# Patient Record
Sex: Female | Born: 1990 | Race: Black or African American | Hispanic: No | Marital: Single | Smoking: Former smoker
Health system: Southern US, Community
[De-identification: ages and names within clinical notes are randomized; demographics above are authoritative.]

## PROBLEM LIST (undated history)

## (undated) DIAGNOSIS — I10 Essential (primary) hypertension: Secondary | ICD-10-CM

## (undated) DIAGNOSIS — R011 Cardiac murmur, unspecified: Secondary | ICD-10-CM

## (undated) DIAGNOSIS — N938 Other specified abnormal uterine and vaginal bleeding: Secondary | ICD-10-CM

---
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
## 2008-05-09 ENCOUNTER — Emergency Department (HOSPITAL_COMMUNITY): Admission: EM | Admit: 2008-05-09 | Discharge: 2008-05-09 | Payer: Self-pay | Admitting: Emergency Medicine

## 2008-06-30 ENCOUNTER — Emergency Department (HOSPITAL_COMMUNITY): Admission: EM | Admit: 2008-06-30 | Discharge: 2008-06-30 | Payer: Self-pay | Admitting: Emergency Medicine

## 2008-07-24 ENCOUNTER — Inpatient Hospital Stay (HOSPITAL_COMMUNITY): Admission: AD | Admit: 2008-07-24 | Discharge: 2008-07-24 | Payer: Self-pay | Admitting: Obstetrics & Gynecology

## 2008-11-28 ENCOUNTER — Inpatient Hospital Stay (HOSPITAL_COMMUNITY): Admission: AD | Admit: 2008-11-28 | Discharge: 2008-11-29 | Payer: Self-pay | Admitting: Obstetrics and Gynecology

## 2009-01-07 ENCOUNTER — Inpatient Hospital Stay (HOSPITAL_COMMUNITY): Admission: AD | Admit: 2009-01-07 | Discharge: 2009-01-07 | Payer: Self-pay | Admitting: Obstetrics and Gynecology

## 2009-01-22 ENCOUNTER — Inpatient Hospital Stay (HOSPITAL_COMMUNITY): Admission: AD | Admit: 2009-01-22 | Discharge: 2009-01-22 | Payer: Self-pay | Admitting: Obstetrics and Gynecology

## 2009-02-14 ENCOUNTER — Inpatient Hospital Stay (HOSPITAL_COMMUNITY): Admission: AD | Admit: 2009-02-14 | Discharge: 2009-02-19 | Payer: Self-pay | Admitting: Obstetrics and Gynecology

## 2009-02-16 ENCOUNTER — Encounter (INDEPENDENT_AMBULATORY_CARE_PROVIDER_SITE_OTHER): Payer: Self-pay | Admitting: Obstetrics and Gynecology

## 2009-07-15 ENCOUNTER — Observation Stay (HOSPITAL_COMMUNITY): Admission: EM | Admit: 2009-07-15 | Discharge: 2009-07-15 | Payer: Self-pay | Admitting: Emergency Medicine

## 2009-09-04 ENCOUNTER — Emergency Department (HOSPITAL_COMMUNITY): Admission: EM | Admit: 2009-09-04 | Discharge: 2009-09-04 | Payer: Self-pay | Admitting: Emergency Medicine

## 2010-02-01 ENCOUNTER — Emergency Department (HOSPITAL_COMMUNITY)
Admission: EM | Admit: 2010-02-01 | Discharge: 2010-02-01 | Payer: Self-pay | Source: Home / Self Care | Admitting: Emergency Medicine

## 2010-02-13 ENCOUNTER — Emergency Department (HOSPITAL_COMMUNITY): Admission: EM | Admit: 2010-02-13 | Discharge: 2010-02-13 | Payer: Self-pay | Admitting: Emergency Medicine

## 2010-03-07 ENCOUNTER — Emergency Department (HOSPITAL_COMMUNITY): Admission: EM | Admit: 2010-03-07 | Discharge: 2009-06-29 | Payer: Self-pay | Admitting: Emergency Medicine

## 2010-06-11 LAB — POCT I-STAT, CHEM 8
BUN: 6 mg/dL (ref 6–23)
Calcium, Ion: 1.15 mmol/L (ref 1.12–1.32)
Chloride: 106 mEq/L (ref 96–112)
Glucose, Bld: 96 mg/dL (ref 70–99)
HCT: 34 % — ABNORMAL LOW (ref 36.0–46.0)
Hemoglobin: 11.6 g/dL — ABNORMAL LOW (ref 12.0–15.0)
Potassium: 3.6 mEq/L (ref 3.5–5.1)
Sodium: 139 mEq/L (ref 135–145)
TCO2: 22 mmol/L (ref 0–100)

## 2010-06-11 LAB — URINALYSIS, ROUTINE W REFLEX MICROSCOPIC
Glucose, UA: NEGATIVE mg/dL
Protein, ur: NEGATIVE mg/dL
Specific Gravity, Urine: 1.016 (ref 1.005–1.030)
pH: 6 (ref 5.0–8.0)

## 2010-06-11 LAB — POCT PREGNANCY, URINE: Preg Test, Ur: NEGATIVE

## 2010-06-11 LAB — DIFFERENTIAL
Basophils Absolute: 0 10*3/uL (ref 0.0–0.1)
Basophils Relative: 1 % (ref 0–1)
Monocytes Relative: 8 % (ref 3–12)
Neutro Abs: 2.9 10*3/uL (ref 1.7–7.7)
Neutrophils Relative %: 61 % (ref 43–77)

## 2010-06-11 LAB — CBC
Hemoglobin: 10 g/dL — ABNORMAL LOW (ref 12.0–15.0)
MCHC: 31.9 g/dL (ref 30.0–36.0)
RDW: 19.8 % — ABNORMAL HIGH (ref 11.5–15.5)
WBC: 4.8 10*3/uL (ref 4.0–10.5)

## 2010-06-11 LAB — POCT CARDIAC MARKERS
CKMB, poc: <1 ng/mL — ABNORMAL LOW (ref 1.0–8.0)
Myoglobin, poc: 39 ng/mL (ref 12–200)

## 2010-06-17 LAB — URINALYSIS, ROUTINE W REFLEX MICROSCOPIC
Glucose, UA: NEGATIVE mg/dL
Ketones, ur: NEGATIVE mg/dL
Nitrite: NEGATIVE
Specific Gravity, Urine: 1.015 (ref 1.005–1.030)
pH: 5.5 (ref 5.0–8.0)
pH: 7.5 (ref 5.0–8.0)

## 2010-06-17 LAB — DIFFERENTIAL
Basophils Absolute: 0 10*3/uL (ref 0.0–0.1)
Basophils Relative: 0 % (ref 0–1)
Eosinophils Absolute: 0 10*3/uL (ref 0.0–0.7)
Eosinophils Relative: 1 % (ref 0–5)

## 2010-06-17 LAB — POCT I-STAT, CHEM 8
Calcium, Ion: 1.22 mmol/L (ref 1.12–1.32)
Chloride: 104 mEq/L (ref 96–112)
Creatinine, Ser: 0.9 mg/dL (ref 0.4–1.2)
Glucose, Bld: 75 mg/dL (ref 70–99)
HCT: 24 % — ABNORMAL LOW (ref 36.0–46.0)

## 2010-06-17 LAB — CBC
HCT: 23.6 % — ABNORMAL LOW (ref 36.0–46.0)
MCHC: 34.1 g/dL (ref 30.0–36.0)
MCV: 89.7 fL (ref 78.0–100.0)
Platelets: 307 10*3/uL (ref 150–400)
RDW: 14.4 % (ref 11.5–15.5)

## 2010-06-17 LAB — URINE MICROSCOPIC-ADD ON

## 2010-06-17 LAB — URINE CULTURE: Colony Count: 30000

## 2010-07-03 LAB — COMPREHENSIVE METABOLIC PANEL
ALT: 10 U/L (ref 0–35)
ALT: 13 U/L (ref 0–35)
AST: 20 U/L (ref 0–37)
AST: 21 U/L (ref 0–37)
Alkaline Phosphatase: 144 U/L — ABNORMAL HIGH (ref 39–117)
CO2: 22 mEq/L (ref 19–32)
Calcium: 8.8 mg/dL (ref 8.4–10.5)
Calcium: 9.4 mg/dL (ref 8.4–10.5)
Creatinine, Ser: 0.74 mg/dL (ref 0.4–1.2)
GFR calc Af Amer: >60 mL/min (ref >60)
GFR calc Af Amer: >60 mL/min (ref >60)
Glucose, Bld: 101 mg/dL — ABNORMAL HIGH (ref 70–99)
Potassium: 3.5 mEq/L (ref 3.5–5.1)
Sodium: 135 mEq/L (ref 135–145)
Sodium: 137 mEq/L (ref 135–145)
Total Protein: 6.3 g/dL (ref 6.0–8.3)
Total Protein: 6.3 g/dL (ref 6.0–8.3)

## 2010-07-03 LAB — CBC
HCT: 31 % — ABNORMAL LOW (ref 36.0–46.0)
HCT: 38.5 % (ref 36.0–46.0)
Hemoglobin: 12.4 g/dL (ref 12.0–15.0)
Hemoglobin: 13.1 g/dL (ref 12.0–15.0)
MCHC: 33.8 g/dL (ref 30.0–36.0)
MCHC: 33.8 g/dL (ref 30.0–36.0)
MCHC: 34 g/dL (ref 30.0–36.0)
MCV: 95.2 fL (ref 78.0–100.0)
Platelets: 146 10*3/uL — ABNORMAL LOW (ref 150–400)
RBC: 3.81 MIL/uL — ABNORMAL LOW (ref 3.87–5.11)
RDW: 13.1 % (ref 11.5–15.5)
RDW: 13.3 % (ref 11.5–15.5)
RDW: 13.3 % (ref 11.5–15.5)
RDW: 13.4 % (ref 11.5–15.5)

## 2010-07-03 LAB — RH IMMUNE GLOB WKUP(>/=20WKS)(NOT WOMEN'S HOSP): Fetal Screen: NEGATIVE

## 2010-07-03 LAB — URINALYSIS, ROUTINE W REFLEX MICROSCOPIC
Bilirubin Urine: NEGATIVE
Glucose, UA: NEGATIVE mg/dL
Ketones, ur: NEGATIVE mg/dL
Specific Gravity, Urine: 1.01 (ref 1.005–1.030)
pH: 5 (ref 5.0–8.0)

## 2010-07-03 LAB — WET PREP, GENITAL

## 2010-07-03 LAB — URINE MICROSCOPIC-ADD ON

## 2010-07-03 LAB — LACTATE DEHYDROGENASE
LDH: 154 U/L (ref 94–250)
LDH: 167 U/L (ref 94–250)

## 2010-07-04 LAB — COMPREHENSIVE METABOLIC PANEL
ALT: 14 U/L (ref 0–35)
AST: 21 U/L (ref 0–37)
Albumin: 2.8 g/dL — ABNORMAL LOW (ref 3.5–5.2)
Alkaline Phosphatase: 107 U/L (ref 39–117)
Calcium: 9.3 mg/dL (ref 8.4–10.5)
GFR calc Af Amer: >60 mL/min (ref >60)
Glucose, Bld: 96 mg/dL (ref 70–99)
Potassium: 3.5 mEq/L (ref 3.5–5.1)
Sodium: 136 mEq/L (ref 135–145)
Total Protein: 6.1 g/dL (ref 6.0–8.3)

## 2010-07-04 LAB — URINE MICROSCOPIC-ADD ON

## 2010-07-04 LAB — CBC
Hemoglobin: 11.8 g/dL — ABNORMAL LOW (ref 12.0–15.0)
MCHC: 33.6 g/dL (ref 30.0–36.0)
RDW: 13.5 % (ref 11.5–15.5)

## 2010-07-04 LAB — WET PREP, GENITAL
Clue Cells Wet Prep HPF POC: NONE SEEN
Trich, Wet Prep: NONE SEEN

## 2010-07-04 LAB — URINALYSIS, ROUTINE W REFLEX MICROSCOPIC
Bilirubin Urine: NEGATIVE
Bilirubin Urine: NEGATIVE
Glucose, UA: NEGATIVE mg/dL
Glucose, UA: NEGATIVE mg/dL
Hgb urine dipstick: NEGATIVE
Hgb urine dipstick: NEGATIVE
Protein, ur: NEGATIVE mg/dL
Specific Gravity, Urine: <1.005 — ABNORMAL LOW (ref 1.005–1.030)
Urobilinogen, UA: 0.2 mg/dL (ref 0.0–1.0)
Urobilinogen, UA: 0.2 mg/dL (ref 0.0–1.0)
pH: 6 (ref 5.0–8.0)

## 2010-07-04 LAB — FETAL FIBRONECTIN: Fetal Fibronectin: NEGATIVE

## 2010-07-04 LAB — GC/CHLAMYDIA PROBE AMP, GENITAL
Chlamydia, DNA Probe: NEGATIVE
GC Probe Amp, Genital: NEGATIVE

## 2010-07-04 LAB — URINE CULTURE

## 2010-07-04 LAB — LACTATE DEHYDROGENASE: LDH: 142 U/L (ref 94–250)

## 2010-07-06 LAB — RH IMMUNE GLOBULIN WORKUP (NOT WOMEN'S HOSP)
ABO/RH(D): O NEG
Antibody Screen: NEGATIVE

## 2010-07-06 LAB — URINALYSIS, ROUTINE W REFLEX MICROSCOPIC
Glucose, UA: NEGATIVE mg/dL
Nitrite: NEGATIVE
Specific Gravity, Urine: 1.02 (ref 1.005–1.030)
pH: 6 (ref 5.0–8.0)

## 2010-07-06 LAB — WET PREP, GENITAL

## 2010-07-10 LAB — URINALYSIS, ROUTINE W REFLEX MICROSCOPIC
Glucose, UA: NEGATIVE mg/dL
Hgb urine dipstick: NEGATIVE
Protein, ur: NEGATIVE mg/dL
Specific Gravity, Urine: 1.014 (ref 1.005–1.030)
pH: 6 (ref 5.0–8.0)

## 2010-07-10 LAB — CBC
HCT: 37.8 % (ref 36.0–46.0)
Hemoglobin: 13.2 g/dL (ref 12.0–15.0)
MCV: 91.6 fL (ref 78.0–100.0)
Platelets: 264 10*3/uL (ref 150–400)
RDW: 13.8 % (ref 11.5–15.5)
WBC: 9.9 10*3/uL (ref 4.0–10.5)

## 2010-07-10 LAB — GC/CHLAMYDIA PROBE AMP, GENITAL
Chlamydia, DNA Probe: NEGATIVE
GC Probe Amp, Genital: NEGATIVE

## 2010-07-10 LAB — RH IMMUNE GLOBULIN WORKUP (NOT WOMEN'S HOSP): Antibody Screen: NEGATIVE

## 2010-07-10 LAB — WET PREP, GENITAL: Trich, Wet Prep: NONE SEEN

## 2010-07-16 LAB — DIFFERENTIAL
Lymphocytes Relative: 27 % (ref 12–46)
Lymphs Abs: 1.9 10*3/uL (ref 0.7–4.0)
Monocytes Absolute: 0.4 10*3/uL (ref 0.1–1.0)
Monocytes Relative: 6 % (ref 3–12)
Neutro Abs: 4.8 10*3/uL (ref 1.7–7.7)
Neutrophils Relative %: 66 % (ref 43–77)

## 2010-07-16 LAB — POCT CARDIAC MARKERS
CKMB, poc: <1 ng/mL — ABNORMAL LOW (ref 1.0–8.0)
Troponin i, poc: <0.05 ng/mL (ref 0.00–0.09)

## 2010-07-16 LAB — POCT I-STAT, CHEM 8
BUN: 10 mg/dL (ref 6–23)
Calcium, Ion: 1.21 mmol/L (ref 1.12–1.32)
Chloride: 103 mEq/L (ref 96–112)
Glucose, Bld: 100 mg/dL — ABNORMAL HIGH (ref 70–99)
HCT: 42 % (ref 36.0–46.0)
Potassium: 4.3 mEq/L (ref 3.5–5.1)

## 2010-07-16 LAB — CBC
Hemoglobin: 13 g/dL (ref 12.0–15.0)
RBC: 4.18 MIL/uL (ref 3.87–5.11)
WBC: 7.3 10*3/uL (ref 4.0–10.5)

## 2010-12-24 LAB — WET PREP, GENITAL: Trich, Wet Prep: NONE SEEN

## 2010-12-24 LAB — URINALYSIS, ROUTINE W REFLEX MICROSCOPIC
Glucose, UA: NEGATIVE
Ketones, ur: NEGATIVE
Protein, ur: NEGATIVE
Urobilinogen, UA: 1

## 2010-12-24 LAB — URINE MICROSCOPIC-ADD ON

## 2010-12-24 LAB — GC/CHLAMYDIA PROBE AMP, GENITAL
Chlamydia, DNA Probe: POSITIVE — AB
GC Probe Amp, Genital: NEGATIVE

## 2010-12-24 LAB — POCT PREGNANCY, URINE: Preg Test, Ur: NEGATIVE

## 2010-12-26 LAB — GC/CHLAMYDIA PROBE AMP, GENITAL: Chlamydia, DNA Probe: NEGATIVE

## 2010-12-26 LAB — URINE CULTURE: Colony Count: 2000

## 2010-12-26 LAB — WET PREP, GENITAL: Clue Cells Wet Prep HPF POC: NONE SEEN

## 2010-12-26 LAB — URINALYSIS, ROUTINE W REFLEX MICROSCOPIC
Bilirubin Urine: NEGATIVE
Hgb urine dipstick: NEGATIVE
Ketones, ur: NEGATIVE
Nitrite: NEGATIVE
Protein, ur: NEGATIVE
Specific Gravity, Urine: 1.021
Urobilinogen, UA: 1

## 2010-12-26 LAB — PREGNANCY, URINE: Preg Test, Ur: NEGATIVE

## 2011-08-25 ENCOUNTER — Encounter (HOSPITAL_COMMUNITY): Payer: Self-pay | Admitting: *Deleted

## 2011-08-25 ENCOUNTER — Emergency Department (HOSPITAL_COMMUNITY)
Admission: EM | Admit: 2011-08-25 | Discharge: 2011-08-26 | Disposition: A | Discharge status: Home / Self Care | Payer: Self-pay | Attending: Emergency Medicine | Admitting: Emergency Medicine

## 2011-08-25 DIAGNOSIS — R319 Hematuria, unspecified: Secondary | ICD-10-CM | POA: Insufficient documentation

## 2011-08-25 DIAGNOSIS — R300 Dysuria: Secondary | ICD-10-CM | POA: Insufficient documentation

## 2011-08-25 DIAGNOSIS — N390 Urinary tract infection, site not specified: Secondary | ICD-10-CM | POA: Insufficient documentation

## 2011-08-25 HISTORY — DX: Essential (primary) hypertension: I10

## 2011-08-25 LAB — PREGNANCY, URINE: Preg Test, Ur: NEGATIVE

## 2011-08-25 NOTE — ED Notes (Signed)
The pt has had bloody urine  Since this am  With a headache also.  lmp 2 days ago

## 2011-08-26 LAB — URINALYSIS, ROUTINE W REFLEX MICROSCOPIC
Glucose, UA: NEGATIVE mg/dL
Ketones, ur: NEGATIVE mg/dL
Nitrite: NEGATIVE
Specific Gravity, Urine: 1.015 (ref 1.005–1.030)
pH: 7.5 (ref 5.0–8.0)

## 2011-08-26 LAB — URINE MICROSCOPIC-ADD ON

## 2011-08-26 MED ORDER — SULFAMETHOXAZOLE-TMP DS 800-160 MG PO TABS
1.0000 | ORAL_TABLET | Freq: Once | ORAL | Status: AC
  Administered 2011-08-26: 1 via ORAL
  Filled 2011-08-26: qty 1

## 2011-08-26 MED ORDER — PHENAZOPYRIDINE HCL 100 MG PO TABS
200.0000 mg | ORAL_TABLET | Freq: Once | ORAL | Status: AC
  Administered 2011-08-26: 200 mg via ORAL
  Filled 2011-08-26 (×2): qty 1

## 2011-08-26 MED ORDER — PHENAZOPYRIDINE HCL 200 MG PO TABS: 100.0000 mg | ORAL_TABLET | Freq: Three times a day (TID) | ORAL | Status: AC | PRN

## 2011-08-26 MED ORDER — SULFAMETHOXAZOLE-TMP DS 800-160 MG PO TABS: 1.0000 | ORAL_TABLET | Freq: Two times a day (BID) | ORAL | Status: AC

## 2011-08-26 NOTE — ED Notes (Signed)
Pt denies any pain or questions upon discharge. 

## 2011-08-26 NOTE — ED Provider Notes (Signed)
History     CSN: [id number]  Arrival date & time 08/25/11  2316   First MD Initiated Contact with Patient 08/25/11 2337      Chief Complaint  Patient presents with  . Hematuria    (Consider location/radiation/quality/duration/timing/severity/associated sxs/prior treatment) HPI Comments: Patient reports, that she's had some dysuria for the past couple days.  She noticed blood in her urine, one time today, resolved.  She is 2-3, days post her last menses  Patient is a 21 y.o. female presenting with hematuria. The history is provided by the patient.  Hematuria This is a new problem. The current episode started today. The problem has been resolved since onset. She describes the hematuria as gross hematuria. The hematuria occurs during the initial portion of her urinary stream. She is experiencing no pain. She describes her urine color as clear. Irritative symptoms do not include frequency. Associated symptoms include dysuria. Pertinent negatives include no fever.    Past Medical History  Diagnosis Date  . Hypertension     History reviewed. No pertinent past surgical history.  No family history on file.  History  Substance Use Topics  . Smoking status: Current Everyday Smoker  . Smokeless tobacco: Not on file  . Alcohol Use: Yes    OB History    Grav Para Term Preterm Abortions TAB SAB Ect Mult Living                  Review of Systems  Constitutional: Negative for fever.  Genitourinary: Positive for dysuria and hematuria. Negative for frequency, vaginal bleeding and vaginal discharge.  Neurological: Negative for dizziness and headaches.    Allergies  Tramadol  Home Medications   Current Outpatient Rx  Name Route Sig Dispense Refill  . ACETAMINOPHEN 325 MG PO TABS Oral Take 650 mg by mouth every 6 (six) hours as needed. For pain    . PHENAZOPYRIDINE HCL 200 MG PO TABS Oral Take 1 tablet (200 mg total) by mouth 3 (three) times daily as needed for pain. 6 tablet 0    . SULFAMETHOXAZOLE-TMP DS 800-160 MG PO TABS Oral Take 1 tablet by mouth 2 (two) times daily. 10 tablet 0    BP 122/78  Pulse 70  Temp(Src) 98.4 F (36.9 C) (Oral)  Resp 18  SpO2 99%  LMP 08/23/2011  Physical Exam  Constitutional: She is oriented to person, place, and time. She appears well-developed and well-nourished.  HENT:  Head: Normocephalic.  Eyes: Pupils are equal, round, and reactive to light.  Neck: Normal range of motion.  Cardiovascular: Normal rate.   Abdominal: Soft. Bowel sounds are normal. She exhibits no distension. There is no tenderness.  Musculoskeletal: Normal range of motion.  Neurological: She is alert and oriented to person, place, and time.  Skin: Skin is warm.    ED Course  Procedures (including critical care time)  Labs Reviewed  URINALYSIS, ROUTINE W REFLEX MICROSCOPIC - Abnormal; Notable for the following:    APPearance CLOUDY (*)    Hgb urine dipstick SMALL (*)    Leukocytes, UA SMALL (*)    All other components within normal limits  PREGNANCY, URINE  URINE MICROSCOPIC-ADD ON   No results found.   1. Urinary tract infection       MDM   Dysuria, frequency, one episode of hematuria.  That has resolved        Arman Filter, NP 08/26/11 0121  Arman Filter, NP 08/26/11 4098

## 2011-08-26 NOTE — ED Provider Notes (Signed)
Medical screening examination/treatment/procedure(s) were performed by non-physician practitioner and as supervising physician I was immediately available for consultation/collaboration.  Doug Sou, MD 08/26/11 [phone]

## 2011-08-26 NOTE — Discharge Instructions (Signed)
Urinary Tract Infection A urinary tract infection (UTI) is often caused by a germ (bacteria). A UTI is usually helped with medicine (antibiotics) that kills germs. Take all the medicine until it is gone. Do this even if you are feeling better. You are usually better in 7 to 10 days. HOME CARE   Drink enough water and fluids to keep your pee (urine) clear or pale yellow. Drink:   Cranberry juice.   Water.   Avoid:   Caffeine.   Tea.   Bubbly (carbonated) drinks.   Alcohol.   Only take medicine as told by your doctor.   To prevent further infections:   Pee often.   After pooping (bowel movement), women should wipe from front to back. Use each tissue only once.   Pee before and after having sex (intercourse).  Ask your doctor when your test results will be ready. Make sure you follow up and get your test results.  GET HELP RIGHT AWAY IF:   There is very bad back pain or lower belly (abdominal) pain.   You get the chills.   You have a fever.   Your baby is older than 3 months with a rectal temperature of 102 F (38.9 C) or higher.   Your baby is 3 months old or younger with a rectal temperature of 100.4 F (38 C) or higher.   You feel sick to your stomach (nauseous) or throw up (vomit).   There is continued burning with peeing.   Your problems are not better in 3 days. Return sooner if you are getting worse.  MAKE SURE YOU:   Understand these instructions.   Will watch your condition.   Will get help right away if you are not doing well or get worse.  Document Released: 09/03/2007 Document Revised: 03/06/2011 Document Reviewed: 09/03/2007 [employer] Patient Information 2012 [employer]. 

## 2011-12-05 ENCOUNTER — Encounter (HOSPITAL_COMMUNITY): Payer: Self-pay | Admitting: *Deleted

## 2011-12-05 ENCOUNTER — Emergency Department (HOSPITAL_COMMUNITY)
Admission: EM | Admit: 2011-12-05 | Discharge: 2011-12-06 | Disposition: A | Discharge status: Home / Self Care | Payer: Self-pay | Attending: Emergency Medicine | Admitting: Emergency Medicine

## 2011-12-05 ENCOUNTER — Emergency Department (HOSPITAL_COMMUNITY): Payer: Self-pay

## 2011-12-05 DIAGNOSIS — N938 Other specified abnormal uterine and vaginal bleeding: Secondary | ICD-10-CM | POA: Insufficient documentation

## 2011-12-05 DIAGNOSIS — N949 Unspecified condition associated with female genital organs and menstrual cycle: Secondary | ICD-10-CM | POA: Insufficient documentation

## 2011-12-05 DIAGNOSIS — I10 Essential (primary) hypertension: Secondary | ICD-10-CM | POA: Insufficient documentation

## 2011-12-05 DIAGNOSIS — Z87891 Personal history of nicotine dependence: Secondary | ICD-10-CM | POA: Insufficient documentation

## 2011-12-05 LAB — WET PREP, GENITAL: Yeast Wet Prep HPF POC: NONE SEEN

## 2011-12-05 LAB — URINALYSIS, MICROSCOPIC ONLY
Bilirubin Urine: NEGATIVE
Ketones, ur: NEGATIVE mg/dL
Nitrite: NEGATIVE
Protein, ur: NEGATIVE mg/dL
pH: 6.5 (ref 5.0–8.0)

## 2011-12-05 LAB — POCT I-STAT, CHEM 8
BUN: 14 mg/dL (ref 6–23)
Creatinine, Ser: 1 mg/dL (ref 0.50–1.10)
Glucose, Bld: 95 mg/dL (ref 70–99)
Potassium: 4.4 mEq/L (ref 3.5–5.1)
Sodium: 142 mEq/L (ref 135–145)
TCO2: 26 mmol/L (ref 0–100)

## 2011-12-05 LAB — PREGNANCY, URINE: Preg Test, Ur: NEGATIVE

## 2011-12-05 NOTE — ED Provider Notes (Signed)
History     CSN: [id number]  Arrival date & time 12/05/11  1701   First MD Initiated Contact with Patient 12/05/11 2035      Chief Complaint  Patient presents with  . Menometrorrhagia    (Consider location/radiation/quality/duration/timing/severity/associated sxs/prior treatment) HPI History provided by pt.   Patient's periods are generally very regular and occur once a month.  She had two light periods that lasted for 7d a piece in August and then started another period 4 days ago.  This period started light but became progressively heavier and she is passing large clots.  She experienced lightheadedness and nausea this afternoon but denies CP/SOB and syncope.  Has mild tenderness of lower abdomen but denies abdominal pain.  No other GU sx.  Has taken a pregnancy test at home and neg.   Past Medical History  Diagnosis Date  . Hypertension     Past Surgical History  Procedure Date  . Cesarean section     History reviewed. No pertinent family history.  History  Substance Use Topics  . Smoking status: Former Smoker    Quit date: 09/04/2011  . Smokeless tobacco: Not on file  . Alcohol Use: No    OB History    Grav Para Term Preterm Abortions TAB SAB Ect Mult Living                  Review of Systems  All other systems reviewed and are negative.    Allergies  Tramadol  Home Medications   Current Outpatient Rx  Name Route Sig Dispense Refill  . ACETAMINOPHEN 325 MG PO TABS Oral Take 650 mg by mouth every 6 (six) hours as needed. For pain      BP 119/88  Pulse 66  Temp 98.5 F (36.9 C) (Oral)  Resp 15  SpO2 100%  LMP 09/11/2011  Physical Exam  Nursing note and vitals reviewed. Constitutional: She is oriented to person, place, and time. She appears well-developed and well-nourished. No distress.  HENT:  Head: Normocephalic and atraumatic.  Eyes:       Normal appearance  Neck: Normal range of motion.  Cardiovascular: Normal rate and regular rhythm.     Pulmonary/Chest: Effort normal and breath sounds normal. No respiratory distress.  Abdominal: Soft. Bowel sounds are normal. She exhibits no distension and no mass. There is no rebound and no guarding.       Mild ttp mid-line lower abd  Genitourinary:       No CVA tenderness.  Nml external genitalia.  No vaginal discharge.  Large blood clots in vaginal vault.  Cervix closed and appears nml.  No cervical motion tenderness but moderate bilateral adnexal ttp.   Musculoskeletal: Normal range of motion.  Neurological: She is alert and oriented to person, place, and time.  Skin: Skin is warm and dry. No rash noted.  Psychiatric: She has a normal mood and affect. Her behavior is normal.    ED Course  Procedures (including critical care time)  Labs Reviewed  WET PREP, GENITAL - Abnormal; Notable for the following:    Clue Cells Wet Prep HPF POC RARE (*)     WBC, Wet Prep HPF POC RARE (*)     All other components within normal limits  URINALYSIS, WITH MICROSCOPIC - Abnormal; Notable for the following:    Hgb urine dipstick LARGE (*)     All other components within normal limits  POCT I-STAT, CHEM 8 - Abnormal; Notable for the following:  Calcium, Ion 1.27 (*)     All other components within normal limits  PREGNANCY, URINE  GC/CHLAMYDIA PROBE AMP, GENITAL   US Transvaginal Non-ob  12/05/2011  *RADIOLOGY REPORT*  Clinical Data: Pelvic pain and tenderness.  Menometrorrhagia.  TRANSABDOMINAL AND TRANSVAGINAL ULTRASOUND OF PELVIS  Technique:  Both transabdominal and transvaginal ultrasound examinations of the pelvis were performed.  Transabdominal technique was performed for global imaging of the pelvis including uterus, ovaries, adnexal regions, and pelvic cul-de-sac.  It was necessary to proceed with endovaginal exam following the transabdominal exam to visualize the ovaries.  Comparison:  09/04/2009  Findings: Uterus:  10.2 x 4.3 x 5.0 cm.  No fibroids or other uterine mass identified.   Endometrium: Double layer thickness measures 18 mm transvaginally. No focal lesion visualized.  Right ovary: 4.5 x 2.2 x 2.2 cm.  Normal appearance.  Left ovary: 3.6 x 2.5 x 2.4 cm.  Normal appearance.  Other Findings:  No free fluid  IMPRESSION: Normal study.  No evidence of pelvic mass or other significant abnormality.   Original Report Authenticated By: Danae Orleans, M.D.    US Pelvis Complete  12/05/2011  *RADIOLOGY REPORT*  Clinical Data: Pelvic pain and tenderness.  Menometrorrhagia.  TRANSABDOMINAL AND TRANSVAGINAL ULTRASOUND OF PELVIS  Technique:  Both transabdominal and transvaginal ultrasound examinations of the pelvis were performed.  Transabdominal technique was performed for global imaging of the pelvis including uterus, ovaries, adnexal regions, and pelvic cul-de-sac.  It was necessary to proceed with endovaginal exam following the transabdominal exam to visualize the ovaries.  Comparison:  09/04/2009  Findings: Uterus:  10.2 x 4.3 x 5.0 cm.  No fibroids or other uterine mass identified.  Endometrium: Double layer thickness measures 18 mm transvaginally. No focal lesion visualized.  Right ovary: 4.5 x 2.2 x 2.2 cm.  Normal appearance.  Left ovary: 3.6 x 2.5 x 2.4 cm.  Normal appearance.  Other Findings:  No free fluid  IMPRESSION: Normal study.  No evidence of pelvic mass or other significant abnormality.   Original Report Authenticated By: Danae Orleans, M.D.      1. Dysfunctional uterine bleeding       MDM  21yo F presents w/ DUB since last month.  Experienced lightheadedness this afternoon but otherwise no sx suggestive of anemia.  On exam, mild tenderness mid-line lower abd, large blood clots in vaginal vault and moderate, bilateral adnexal ttp.  Pregnancy test neg and hgb 13.3.  Will [place] abd because patient does not have follow up.    [place] neg.  Results discussed w/ pt.  Pt prescribed provera and referred to GYN.  Return precautions including fever, worsening pain, syncope, CP and SOB  discussed.         Otilio Miu, [place] 12/06/11 [phone]

## 2011-12-05 NOTE — ED Notes (Signed)
Pt had normal period x 1 month, then in past 4 days pt reports extremely heavy menstruation w/ clots. Pt states when she has stood up in past 2 days she had heavy flow that was not contained by both pads and tampons.

## 2011-12-05 NOTE — ED Notes (Signed)
PA at bedside.

## 2011-12-05 NOTE — ED Notes (Signed)
Patient transported to Ultrasound 

## 2011-12-06 LAB — GC/CHLAMYDIA PROBE AMP, GENITAL: GC Probe Amp, Genital: NEGATIVE

## 2011-12-06 MED ORDER — MEDROXYPROGESTERONE ACETATE 5 MG PO TABS: 5.0000 mg | ORAL_TABLET | Freq: Every day | ORAL | Status: DC

## 2011-12-06 NOTE — ED Provider Notes (Signed)
Medical screening examination/treatment/procedure(s) were performed by non-physician practitioner and as supervising physician I was immediately available for consultation/collaboration.   Neri Samek, MD 12/06/11 1059 

## 2013-04-28 ENCOUNTER — Encounter (HOSPITAL_COMMUNITY): Payer: Self-pay | Admitting: Emergency Medicine

## 2013-04-28 ENCOUNTER — Emergency Department (INDEPENDENT_AMBULATORY_CARE_PROVIDER_SITE_OTHER)
Admission: EM | Admit: 2013-04-28 | Discharge: 2013-04-28 | Disposition: A | Discharge status: Home / Self Care | Payer: Self-pay | Source: Home / Self Care | Attending: Family Medicine | Admitting: Family Medicine

## 2013-04-28 DIAGNOSIS — J111 Influenza due to unidentified influenza virus with other respiratory manifestations: Secondary | ICD-10-CM

## 2013-04-28 DIAGNOSIS — R69 Illness, unspecified: Principal | ICD-10-CM

## 2013-04-28 MED ORDER — ONDANSETRON HCL 4 MG PO TABS: 4.0000 mg | ORAL_TABLET | Freq: Four times a day (QID) | ORAL | Status: DC

## 2013-04-28 MED ORDER — ONDANSETRON 4 MG PO TBDP
8.0000 mg | ORAL_TABLET | Freq: Once | ORAL | Status: AC
  Administered 2013-04-28: 8 mg via ORAL

## 2013-04-28 MED ORDER — ONDANSETRON 4 MG PO TBDP
ORAL_TABLET | ORAL | Status: AC
Start: 2013-04-28 — End: 2013-04-28
  Filled 2013-04-28: qty 2

## 2013-04-28 MED ORDER — OSELTAMIVIR PHOSPHATE 75 MG PO CAPS: 75.0000 mg | ORAL_CAPSULE | Freq: Two times a day (BID) | ORAL | Status: DC

## 2013-04-28 NOTE — ED Provider Notes (Signed)
CSN: [id number]     Arrival date & time 04/28/13  1416 History   First MD Initiated Contact with Patient 04/28/13 1607     Chief Complaint  Patient presents with  . URI   (Consider location/radiation/quality/duration/timing/severity/associated sxs/prior Treatment) Patient is a 23 y.o. female presenting with URI. The history is provided by the patient.  URI Presenting symptoms: congestion, cough, fatigue and fever   Presenting symptoms: no rhinorrhea and no sore throat   Onset quality:  Sudden Duration:  8 hours Progression:  Unchanged Chronicity:  New Relieved by:  None tried Worsened by:  Nothing tried Ineffective treatments:  None tried Associated symptoms: myalgias   Risk factors: no sick contacts   Risk factors comment:  Took flu shot this yr., is in [employer].   Past Medical History  Diagnosis Date  . Hypertension    Past Surgical History  Procedure Laterality Date  . Cesarean section     History reviewed. No pertinent family history. History  Substance Use Topics  . Smoking status: Former Smoker    Quit date: 09/04/2011  . Smokeless tobacco: Not on file  . Alcohol Use: No   OB History   Grav Para Term Preterm Abortions TAB SAB Ect Mult Living                 Review of Systems  Constitutional: Positive for fever, chills, appetite change and fatigue.  HENT: Positive for congestion. Negative for rhinorrhea and sore throat.   Respiratory: Positive for cough.   Gastrointestinal: Positive for nausea. Negative for vomiting, abdominal pain, diarrhea and constipation.  Genitourinary: Negative.   Musculoskeletal: Positive for myalgias.    Allergies  Tramadol  Home Medications   Current Outpatient Rx  Name  Route  Sig  Dispense  Refill  . acetaminophen (TYLENOL) 325 MG tablet   Oral   Take 650 mg by mouth every 6 (six) hours as needed. For pain         . EXPIRED: medroxyPROGESTERone (PROVERA) 5 MG tablet   Oral   Take 1 tablet (5 mg total) by mouth  daily.   5 tablet   0   . ondansetron (ZOFRAN) 4 MG tablet   Oral   Take 1 tablet (4 mg total) by mouth every 6 (six) hours. Prn n/v   6 tablet   0   . oseltamivir (TAMIFLU) 75 MG capsule   Oral   Take 1 capsule (75 mg total) by mouth every 12 (twelve) hours. Take all of medication.   10 capsule   0    BP 114/71  Pulse 57  Temp(Src) 99.3 F (37.4 C) (Oral)  Resp 18  SpO2 100%  LMP 04/09/2013 Physical Exam  Nursing note and vitals reviewed. Constitutional: She is oriented to person, place, and time. She appears well-developed and well-nourished.  HENT:  Head: Normocephalic.  Right Ear: External ear normal.  Left Ear: External ear normal.  Mouth/Throat: Oropharynx is clear and moist.  Eyes: Pupils are equal, round, and reactive to light.  Neck: Normal range of motion. Neck supple.  Cardiovascular: Normal rate, regular rhythm, normal heart sounds and intact distal pulses.   Pulmonary/Chest: Effort normal and breath sounds normal.  Abdominal: Soft. Bowel sounds are normal.  Lymphadenopathy:    She has no cervical adenopathy.  Neurological: She is alert and oriented to person, place, and time.  Skin: Skin is warm and dry.    ED Course  Procedures (including critical care time) Labs Review Labs Reviewed -  No data to display Imaging Review No results found.    MDM      Linna Hoff, MD 04/28/13 [phone]

## 2013-04-28 NOTE — ED Notes (Signed)
C/o cold sx States she is coughing, nausea, lightheaded, pressure in head OTC medications taking but no relief.

## 2013-05-22 ENCOUNTER — Telehealth (HOSPITAL_COMMUNITY): Payer: Self-pay

## 2013-05-22 ENCOUNTER — Emergency Department (HOSPITAL_COMMUNITY)
Admission: EM | Admit: 2013-05-22 | Discharge: 2013-05-22 | Disposition: A | Discharge status: Home / Self Care | Payer: Worker's Compensation | Source: Home / Self Care | Attending: Family Medicine | Admitting: Family Medicine

## 2013-05-22 ENCOUNTER — Encounter (HOSPITAL_COMMUNITY): Payer: Self-pay | Admitting: Emergency Medicine

## 2013-05-22 ENCOUNTER — Emergency Department (INDEPENDENT_AMBULATORY_CARE_PROVIDER_SITE_OTHER)
Admission: EM | Admit: 2013-05-22 | Discharge: 2013-05-22 | Disposition: A | Discharge status: Home / Self Care | Payer: Worker's Compensation | Source: Home / Self Care | Attending: Family Medicine | Admitting: Family Medicine

## 2013-05-22 DIAGNOSIS — N940 Mittelschmerz: Secondary | ICD-10-CM

## 2013-05-22 LAB — POCT PREGNANCY, URINE: PREG TEST UR: NEGATIVE

## 2013-05-22 LAB — POCT URINALYSIS DIP (DEVICE)
Bilirubin Urine: NEGATIVE
GLUCOSE, UA: NEGATIVE mg/dL
Hgb urine dipstick: NEGATIVE
Ketones, ur: NEGATIVE mg/dL
LEUKOCYTES UA: NEGATIVE
NITRITE: NEGATIVE
Protein, ur: NEGATIVE mg/dL
SPECIFIC GRAVITY, URINE: 1.02 (ref 1.005–1.030)
UROBILINOGEN UA: 0.2 mg/dL (ref 0.0–1.0)
pH: 5.5 (ref 5.0–8.0)

## 2013-05-22 MED ORDER — IBUPROFEN 800 MG PO TABS
800.0000 mg | ORAL_TABLET | Freq: Once | ORAL | Status: AC
  Administered 2013-05-22: 800 mg via ORAL

## 2013-05-22 MED ORDER — IBUPROFEN 800 MG PO TABS
ORAL_TABLET | ORAL | Status: AC
  Filled 2013-05-22: qty 1

## 2013-05-22 NOTE — Discharge Instructions (Signed)
Mittelschmerz  Mittelschmerz is lower abdominal pain that happens between menstrual periods. Mittelschmerz is a [place] word that means "middle pain." It may occur right before, during, or after ovulation. It is usually felt on either the right or left side, depending on which ovary is passing the egg.  CAUSES  Pain may be felt when:  There is irritation (inflammation) inside the abdomen. This is caused by the small amount of blood or fluid that may come from releasing the egg.  The covering of the ovary stretches.  Ovarian cysts develop.  You have endometriosis. This is when the uterine lining tissue grows outside of the uterus.  You have endometriomas. These are cysts that are formed by endometrial tissue. SYMPTOMS  Pain may be:  One-sided pain unless both ovaries are ovulating at the same time. If both ovaries are ovulating, there may be pain on both sides. This pain is often repeated every month. At times, there may be a month or two with no pain.  Dull, cramping, or sharp.  Short-lived or last up to 24 to 48 hours.  Felt with bowel movements, diarrhea, or intercourse.  Accompanied by a slight amount of vaginal bleeding. DIAGNOSIS   Your caregiver will take a history and do a physical exam.  Blood tests and abdominal ultrasounds may be performed if the problem continues, becomes worse, or does not respond to the usual treatment.  A thin, lighted tube may be put into your abdomen (laparoscopy) to check for problems if the pain gets worse or does not go away. TREATMENT  Usually, no treatment is needed. If treatment is needed, it may include:  Taking over-the-counter pain relievers.  Taking birth control pills (oral contraceptives). This may be used to stop ovulation.  Medical or surgical treatment if you have endometriomas. Together, you and your caregiver can decide which course of treatment is best for you. HOME CARE INSTRUCTIONS   Only take over-the-counter or  prescription medicines for pain, discomfort, or fever as directed by your caregiver. Do not use aspirin. Aspirin may increase bleeding.  Write down when the pain comes in relation to your menstrual period. Write down how bad it is, if you have a fever with the pain, and how long it lasts. SEEK MEDICAL CARE IF:   Your pain increases and is not controlled with medicine.  Your pain is on both sides of your abdomen.  You develop vaginal bleeding (more than just spotting) with the pain.  You have a fever.  You develop nausea or vomiting.  You feel lightheaded or faint. MAKE SURE YOU:   Understand these instructions.  Will watch your condition.  Will get help right away if you are not doing well or get worse. Document Released: 03/07/2002 Document Revised: 06/09/2011 Document Reviewed: 06/14/2010 [facility] Patient Information 2014 [place], [place].  Ibuprofen or tylenol as directed on packaging for discomfort.

## 2013-05-22 NOTE — ED Provider Notes (Signed)
CSN: [id number]     Arrival date & time 05/22/13  1339 History   First MD Initiated Contact with Patient 05/22/13 1450     Chief Complaint  Patient presents with  . 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 No changes reported.   The history is provided by the patient.    Past Medical History  Diagnosis Date  . Hypertension    Past Surgical History  Procedure Laterality Date  . Cesarean section     No family history on file. History  Substance Use Topics  . Smoking status: Former Smoker    Quit date: 09/04/2011  . Smokeless tobacco: Not on file  . Alcohol Use: No   OB History   Grav Para Term Preterm Abortions TAB SAB Ect Mult Living                 Review of Systems  All other systems reviewed and are negative.      Allergies  Tramadol  Home Medications   Current Outpatient Rx  Name  Route  Sig  Dispense  Refill  . acetaminophen (TYLENOL) 325 MG tablet   Oral   Take 650 mg by mouth every 6 (six) hours as needed. For pain         . EXPIRED: medroxyPROGESTERone (PROVERA) 5 MG tablet   Oral   Take 1 tablet (5 mg total) by mouth daily.   5 tablet   0   . ondansetron (ZOFRAN) 4 MG tablet   Oral   Take 1 tablet (4 mg total) by mouth every 6 (six) hours. Prn n/v   6 tablet   0   . oseltamivir (TAMIFLU) 75 MG capsule   Oral   Take 1 capsule (75 mg total) by mouth every 12 (twelve) hours. Take all of medication.   10 capsule   0    BP 125/78  Pulse 68  Temp(Src) 99.2 F (37.3 C) (Oral)  Resp 16  SpO2 100%  LMP 05/10/2013 Physical Exam  Nursing note and vitals reviewed. Constitutional: She is oriented to person, place, and time. She appears well-developed and well-nourished. No distress.  HENT:  Head: Normocephalic  and atraumatic.  Eyes: Conjunctivae are normal.  Cardiovascular: Normal rate.   Pulmonary/Chest: Effort normal.  Musculoskeletal: Normal range of motion.  Neurological: She is alert and oriented to person, place, and time.  Skin: Skin is warm and dry.  Psychiatric: She has a normal mood and affect. Her behavior is normal.    ED Course  Procedures (including critical care time) Labs Review Labs Reviewed - No data to display Imaging Review No results found.    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 Paperwork completed.  Jess BartersJennifer Lee MilladorePresson, [place] 05/23/13 [phone]

## 2013-05-22 NOTE — ED Notes (Signed)
Need elevation papers for job

## 2013-05-22 NOTE — ED Notes (Signed)
C/o groin pain which started this morning states she was really not doing much when she had this pain feeling in groin area States she has stretched muscle  States she was feeling nausea.   Denies vomiting, any urinary problems

## 2013-05-22 NOTE — ED Provider Notes (Signed)
CSN: [id number]     Arrival date & time 05/22/13  0904 History   First MD Initiated Contact with Patient 05/22/13 [phone]     Chief Complaint  Patient presents with  . Groin Pain     (Consider location/radiation/quality/duration/timing/severity/associated sxs/prior Treatment) HPI Comments: Patient presents with left groin and pelvic pain that began this morning with mild associated nausea. Pain is described as a "pulling sensation" that primarily occurs with movement. Denies GI or GU symptoms. No fever.  LNBM: 05/21/2013 LNMP: week of 05/10/2013, patient has regular 28 day menstrual cycle PCP: none  Patient is a 23 y.o. female presenting with groin pain. The history is provided by the patient.  Groin Pain    Past Medical History  Diagnosis Date  . Hypertension    Past Surgical History  Procedure Laterality Date  . Cesarean section     History reviewed. No pertinent family history. History  Substance Use Topics  . Smoking status: Former Smoker    Quit date: 09/04/2011  . Smokeless tobacco: Not on file  . Alcohol Use: No   OB History   Grav Para Term Preterm Abortions TAB SAB Ect Mult Living                 Review of Systems  Constitutional: Negative.   HENT: Negative.   Eyes: Negative.   Respiratory: Negative.   Cardiovascular: Negative.   Gastrointestinal: Negative.   Endocrine: Negative for polydipsia, polyphagia and polyuria.  Genitourinary: Positive for pelvic pain. Negative for dysuria, urgency, frequency, hematuria, flank pain, decreased urine volume, vaginal bleeding, vaginal discharge, difficulty urinating, genital sores, vaginal pain and menstrual problem.  Musculoskeletal: Negative.   Skin: Negative.   Neurological: Negative.   Hematological: Negative for adenopathy.      Allergies  Tramadol  Home Medications   Current Outpatient Rx  Name  Route  Sig  Dispense  Refill  . acetaminophen (TYLENOL) 325 MG tablet   Oral   Take 650 mg by mouth every 6  (six) hours as needed. For pain         . EXPIRED: medroxyPROGESTERone (PROVERA) 5 MG tablet   Oral   Take 1 tablet (5 mg total) by mouth daily.   5 tablet   0   . ondansetron (ZOFRAN) 4 MG tablet   Oral   Take 1 tablet (4 mg total) by mouth every 6 (six) hours. Prn n/v   6 tablet   0   . oseltamivir (TAMIFLU) 75 MG capsule   Oral   Take 1 capsule (75 mg total) by mouth every 12 (twelve) hours. Take all of medication.   10 capsule   0    BP 132/81  Pulse 72  Temp(Src) 99.3 F (37.4 C) (Oral)  Resp 16  SpO2 96%  LMP 05/10/2013 Physical Exam  Nursing note and vitals reviewed. Constitutional: She is oriented to person, place, and time. She appears well-developed and well-nourished. No distress.  +ambulatory without difficulty  HENT:  Head: Normocephalic and atraumatic.  Eyes: Conjunctivae are normal. No scleral icterus.  Neck: Normal range of motion. Neck supple.  Cardiovascular: Normal rate, regular rhythm and normal heart sounds.   Pulmonary/Chest: Effort normal and breath sounds normal.  Abdominal: Soft. Normal appearance and bowel sounds are normal. She exhibits no mass. There is no hepatosplenomegaly. There is tenderness. There is no rigidity, no rebound, no guarding, no CVA tenderness, no tenderness at McBurney's point and negative Murphy's sign. No hernia. Hernia confirmed negative in the ventral  area, confirmed negative in the right inguinal area and confirmed negative in the left inguinal area.    Musculoskeletal: Normal range of motion.  Neurological: She is alert and oriented to person, place, and time.  Skin: Skin is warm and dry.  Psychiatric: She has a normal mood and affect. Her behavior is normal.    ED Course  Procedures (including critical care time) Labs Review Labs Reviewed  POCT URINALYSIS DIP (DEVICE)  POCT PREGNANCY, URINE   Imaging Review No results found.    MDM   Final diagnoses:  Mittelschmerz  Exam most consistent with  mittelschmerz. Will treat with ibuprofen and advise observation. Cautioned that if pain becomes severe or persistent or associated with additional symptoms, will advise re-evaluation.    Jess Barters [place], [place] 05/22/13 [phone]

## 2013-05-22 NOTE — ED Notes (Signed)
Chart was reviewed by Lemmie EvensJennifer Presson PA.  She states at the time of visit she asked patient if there was any paperwork to be filled out.  She stated there was not.  PA stated in order for patient's paperwork to be filled out she would need to come back for a new visit.  Supervising officer made aware and stated patient would come back in.

## 2013-05-24 NOTE — ED Provider Notes (Signed)
Medical screening examination/treatment/procedure(s) were performed by a resident physician or non-physician practitioner and as the supervising physician I was immediately available for consultation/collaboration.  Maron Stanzione, MD    Stephone Gum S Chancie Lampert, MD 05/24/13 0728 

## 2013-05-24 NOTE — ED Provider Notes (Signed)
Medical screening examination/treatment/procedure(s) were performed by a resident physician or non-physician practitioner and as the supervising physician I was immediately available for consultation/collaboration.  Clementeen GrahamEvan Corey, MD    Rodolph BongEvan S Corey, MD 05/24/13 [phone]

## 2014-06-07 ENCOUNTER — Emergency Department (HOSPITAL_COMMUNITY)
Admission: EM | Admit: 2014-06-07 | Discharge: 2014-06-07 | Disposition: A | Discharge status: Home / Self Care | Payer: Self-pay | Attending: Emergency Medicine | Admitting: Emergency Medicine

## 2014-06-07 ENCOUNTER — Encounter (HOSPITAL_COMMUNITY): Payer: Self-pay | Admitting: Emergency Medicine

## 2014-06-07 DIAGNOSIS — Z79899 Other long term (current) drug therapy: Secondary | ICD-10-CM | POA: Insufficient documentation

## 2014-06-07 DIAGNOSIS — Z87891 Personal history of nicotine dependence: Secondary | ICD-10-CM | POA: Insufficient documentation

## 2014-06-07 DIAGNOSIS — R200 Anesthesia of skin: Secondary | ICD-10-CM | POA: Insufficient documentation

## 2014-06-07 DIAGNOSIS — Z3202 Encounter for pregnancy test, result negative: Secondary | ICD-10-CM | POA: Insufficient documentation

## 2014-06-07 DIAGNOSIS — I10 Essential (primary) hypertension: Secondary | ICD-10-CM | POA: Insufficient documentation

## 2014-06-07 LAB — COMPREHENSIVE METABOLIC PANEL
ALT: 14 U/L (ref 0–35)
AST: 18 U/L (ref 0–37)
Albumin: 3.5 g/dL (ref 3.5–5.2)
Alkaline Phosphatase: 72 U/L (ref 39–117)
Anion gap: 5 (ref 5–15)
BUN: 8 mg/dL (ref 6–23)
CHLORIDE: 104 mmol/L (ref 96–112)
CO2: 26 mmol/L (ref 19–32)
Calcium: 8.7 mg/dL (ref 8.4–10.5)
Creatinine, Ser: 0.95 mg/dL (ref 0.50–1.10)
GFR calc Af Amer: >90 mL/min (ref >90)
GFR, EST NON AFRICAN AMERICAN: 83 mL/min — AB (ref >90)
Glucose, Bld: 91 mg/dL (ref 70–99)
POTASSIUM: 3.7 mmol/L (ref 3.5–5.1)
Sodium: 135 mmol/L (ref 135–145)
TOTAL PROTEIN: 6.8 g/dL (ref 6.0–8.3)
Total Bilirubin: 0.6 mg/dL (ref 0.3–1.2)

## 2014-06-07 LAB — CBC WITH DIFFERENTIAL/PLATELET
Basophils Absolute: 0 10*3/uL (ref 0.0–0.1)
Basophils Relative: 1 % (ref 0–1)
EOS ABS: 0.1 10*3/uL (ref 0.0–0.7)
EOS PCT: 1 % (ref 0–5)
HEMATOCRIT: 33.6 % — AB (ref 36.0–46.0)
HEMOGLOBIN: 11.9 g/dL — AB (ref 12.0–15.0)
Lymphocytes Relative: 26 % (ref 12–46)
Lymphs Abs: 1.9 10*3/uL (ref 0.7–4.0)
MCH: 28.2 pg (ref 26.0–34.0)
MCHC: 35.4 g/dL (ref 30.0–36.0)
MCV: 79.6 fL (ref 78.0–100.0)
MONO ABS: 0.3 10*3/uL (ref 0.1–1.0)
Monocytes Relative: 4 % (ref 3–12)
NEUTROS ABS: 4.9 10*3/uL (ref 1.7–7.7)
NEUTROS PCT: 68 % (ref 43–77)
PLATELETS: 278 10*3/uL (ref 150–400)
RBC: 4.22 MIL/uL (ref 3.87–5.11)
RDW: 16.5 % — AB (ref 11.5–15.5)
WBC: 7.2 10*3/uL (ref 4.0–10.5)

## 2014-06-07 LAB — URINALYSIS, ROUTINE W REFLEX MICROSCOPIC
Bilirubin Urine: NEGATIVE
Glucose, UA: NEGATIVE mg/dL
Hgb urine dipstick: NEGATIVE
KETONES UR: NEGATIVE mg/dL
NITRITE: NEGATIVE
PROTEIN: NEGATIVE mg/dL
Specific Gravity, Urine: 1.015 (ref 1.005–1.030)
Urobilinogen, UA: 0.2 mg/dL (ref 0.0–1.0)
pH: 6 (ref 5.0–8.0)

## 2014-06-07 LAB — URINE MICROSCOPIC-ADD ON

## 2014-06-07 LAB — CK: Total CK: 151 U/L (ref 7–177)

## 2014-06-07 LAB — POC URINE PREG, ED: PREG TEST UR: NEGATIVE

## 2014-06-07 NOTE — Discharge Instructions (Signed)
As discussed, your evaluation today has been largely reassuring.  But, it is important that you monitor your condition carefully, and do not hesitate to return to the ED if you develop new, or concerning changes in your condition. ? ?Otherwise, please follow-up with your physician for appropriate ongoing care. ? ?

## 2014-06-07 NOTE — ED Notes (Signed)
Pt c/o numbness to feet and whole body x several days intermittently; pt sts HA at present

## 2014-06-07 NOTE — ED Notes (Signed)
Patient stated she is unable to provide a urine sample at this time. 

## 2014-06-07 NOTE — ED Provider Notes (Signed)
CSN: [id number]     Arrival date & time 06/07/14  1536 History   First MD Initiated Contact with Patient 06/07/14 1709     Chief Complaint  Patient presents with  . Numbness     (Consider location/radiation/quality/duration/timing/severity/associated sxs/prior Treatment) HPI Patient presents with concern of intermittent numbness. Symptoms began 4 days ago, initially with left lower extremity, lateral tingling. Subsequent, the patient has had episodes of numbness, tingling throughout her body, occurring without clear precipitant, lasting for variable amounts of time, and waxing/waning in severity. There is no pain, no lightheadedness, no syncope, no nausea, no fever, no chills, no change in menstrual cycles. Last menstrual cycle 2 weeks ago. Patient states that she is generally well. She does not smoke, does not drink Past Medical History  Diagnosis Date  . Hypertension    Past Surgical History  Procedure Laterality Date  . Cesarean section     History reviewed. No pertinent family history. History  Substance Use Topics  . Smoking status: Former Smoker    Quit date: 09/04/2011  . Smokeless tobacco: Not on file  . Alcohol Use: No   OB History    No data available     Review of Systems  Constitutional:       Per HPI, otherwise negative  HENT:       Per HPI, otherwise negative  Respiratory:       Per HPI, otherwise negative  Cardiovascular:       Per HPI, otherwise negative  Gastrointestinal: Negative for vomiting.  Endocrine:       Negative aside from HPI  Genitourinary:       Neg aside from HPI   Musculoskeletal:       Per HPI, otherwise negative  Skin: Negative.   Neurological: Negative for syncope.      Allergies  Tramadol  Home Medications   Prior to Admission medications   Medication Sig Start Date End Date Taking? Authorizing Provider  acetaminophen (TYLENOL) 325 MG tablet Take 650 mg by mouth every 6 (six) hours as needed. For pain    Historical  Provider, MD  medroxyPROGESTERone (PROVERA) 5 MG tablet Take 1 tablet (5 mg total) by mouth daily. 12/06/11 12/05/12  Catherine Schinlever, PA-C  ondansetron (ZOFRAN) 4 MG tablet Take 1 tablet (4 mg total) by mouth every 6 (six) hours. Prn n/v 04/28/13   Linna HoffJames D Kindl, MD  oseltamivir (TAMIFLU) 75 MG capsule Take 1 capsule (75 mg total) by mouth every 12 (twelve) hours. Take all of medication. 04/28/13   Linna HoffJames D Kindl, MD   BP 118/68 mmHg  Pulse 55  Temp(Src) 98.3 F (36.8 C) (Oral)  Resp 18  SpO2 100%  LMP 05/24/2014 Physical Exam  Constitutional: She is oriented to person, place, and time. She appears well-developed and well-nourished. No distress.  HENT:  Head: Normocephalic and atraumatic.  Eyes: Conjunctivae and EOM are normal.  Cardiovascular: Normal rate and regular rhythm.   Pulmonary/Chest: Effort normal and breath sounds normal. No stridor. No respiratory distress.  Abdominal: She exhibits no distension.  Musculoskeletal: She exhibits no edema.  Neurological: She is alert and oriented to person, place, and time. She displays no atrophy and no tremor. No cranial nerve deficit or sensory deficit. She exhibits normal muscle tone. She displays no seizure activity. Coordination normal.  Patient has appropriate sensation in all 4 extremities, strength in all 4 extremities, no facial asymmetry, no speech deficits.  Skin: Skin is warm and dry.  Psychiatric: She has a normal  mood and affect.  Nursing note and vitals reviewed.   ED Course  Procedures (including critical care time) Labs Review Labs Reviewed  COMPREHENSIVE METABOLIC PANEL - Abnormal; Notable for the following:    GFR calc non Af Amer 83 (*)    All other components within normal limits  CBC WITH DIFFERENTIAL/PLATELET - Abnormal; Notable for the following:    Hemoglobin 11.9 (*)    HCT 33.6 (*)    RDW 16.5 (*)    All other components within normal limits  URINALYSIS, ROUTINE W REFLEX MICROSCOPIC - Abnormal; Notable for  the following:    Leukocytes, UA TRACE (*)    All other components within normal limits  URINE MICROSCOPIC-ADD ON - Abnormal; Notable for the following:    Squamous Epithelial / LPF FEW (*)    Bacteria, UA MANY (*)    All other components within normal limits  CK  POC URINE PREG, ED    7:55 PM Patient states that her tingling has resolved.  She has no ongoing complaints. We discussed all findings at length, and the patient will follow-up with our ambulatory clinic.    MDM   Well-appearing female presents with several days of ongoing tingling, numbness. Here the patient is awake, alert, hemodynamically stable, neurologically intact. Given the patient's description of dysesthesia in an unusual distribution, patient had evaluation to exclude electrolyte abnormalities, infection. Patient's labs were largely reassuring, she remained in no distress, with no new complaint here, was discharged in stable condition to follow-up as an outpatient.   Gerhard Munch, MD 06/07/14 [phone]

## 2014-11-03 ENCOUNTER — Emergency Department (HOSPITAL_COMMUNITY): Payer: Medicaid Other

## 2014-11-03 ENCOUNTER — Emergency Department (HOSPITAL_COMMUNITY)
Admission: EM | Admit: 2014-11-03 | Discharge: 2014-11-03 | Disposition: A | Discharge status: Home / Self Care | Payer: Self-pay | Attending: Emergency Medicine | Admitting: Emergency Medicine

## 2014-11-03 ENCOUNTER — Encounter (HOSPITAL_COMMUNITY): Payer: Self-pay

## 2014-11-03 ENCOUNTER — Emergency Department (HOSPITAL_COMMUNITY): Payer: Self-pay

## 2014-11-03 DIAGNOSIS — I10 Essential (primary) hypertension: Secondary | ICD-10-CM | POA: Insufficient documentation

## 2014-11-03 DIAGNOSIS — R011 Cardiac murmur, unspecified: Secondary | ICD-10-CM | POA: Insufficient documentation

## 2014-11-03 DIAGNOSIS — Z79899 Other long term (current) drug therapy: Secondary | ICD-10-CM | POA: Insufficient documentation

## 2014-11-03 DIAGNOSIS — Z8742 Personal history of other diseases of the female genital tract: Secondary | ICD-10-CM | POA: Insufficient documentation

## 2014-11-03 DIAGNOSIS — I319 Disease of pericardium, unspecified: Secondary | ICD-10-CM | POA: Insufficient documentation

## 2014-11-03 DIAGNOSIS — Z3202 Encounter for pregnancy test, result negative: Secondary | ICD-10-CM | POA: Insufficient documentation

## 2014-11-03 DIAGNOSIS — Z720 Tobacco use: Secondary | ICD-10-CM | POA: Insufficient documentation

## 2014-11-03 HISTORY — DX: Other specified abnormal uterine and vaginal bleeding: N93.8

## 2014-11-03 HISTORY — DX: Cardiac murmur, unspecified: R01.1

## 2014-11-03 LAB — COMPREHENSIVE METABOLIC PANEL
ALT: 15 U/L (ref 14–54)
ANION GAP: 3 — AB (ref 5–15)
AST: 19 U/L (ref 15–41)
Albumin: 3.7 g/dL (ref 3.5–5.0)
Alkaline Phosphatase: 84 U/L (ref 38–126)
BUN: 12 mg/dL (ref 6–20)
CALCIUM: 9.1 mg/dL (ref 8.9–10.3)
CHLORIDE: 107 mmol/L (ref 101–111)
CO2: 27 mmol/L (ref 22–32)
Creatinine, Ser: 1.02 mg/dL — ABNORMAL HIGH (ref 0.44–1.00)
GFR calc Af Amer: >60 mL/min (ref >60)
GFR calc non Af Amer: >60 mL/min (ref >60)
Glucose, Bld: 106 mg/dL — ABNORMAL HIGH (ref 65–99)
Potassium: 4 mmol/L (ref 3.5–5.1)
SODIUM: 137 mmol/L (ref 135–145)
Total Bilirubin: 0.5 mg/dL (ref 0.3–1.2)
Total Protein: 7.6 g/dL (ref 6.5–8.1)

## 2014-11-03 LAB — I-STAT BETA HCG BLOOD, ED (MC, WL, AP ONLY)

## 2014-11-03 LAB — CBC
HCT: 38.4 % (ref 36.0–46.0)
HEMOGLOBIN: 13.2 g/dL (ref 12.0–15.0)
MCH: 29.5 pg (ref 26.0–34.0)
MCHC: 34.4 g/dL (ref 30.0–36.0)
MCV: 85.9 fL (ref 78.0–100.0)
Platelets: 290 10*3/uL (ref 150–400)
RBC: 4.47 MIL/uL (ref 3.87–5.11)
RDW: 13.7 % (ref 11.5–15.5)
WBC: 6.3 10*3/uL (ref 4.0–10.5)

## 2014-11-03 LAB — URINALYSIS, ROUTINE W REFLEX MICROSCOPIC
BILIRUBIN URINE: NEGATIVE
Glucose, UA: NEGATIVE mg/dL
Hgb urine dipstick: NEGATIVE
Ketones, ur: NEGATIVE mg/dL
Leukocytes, UA: NEGATIVE
NITRITE: NEGATIVE
PH: 6.5 (ref 5.0–8.0)
Protein, ur: NEGATIVE mg/dL
SPECIFIC GRAVITY, URINE: 1.016 (ref 1.005–1.030)
UROBILINOGEN UA: 0.2 mg/dL (ref 0.0–1.0)

## 2014-11-03 LAB — LIPASE, BLOOD: Lipase: 27 U/L (ref 22–51)

## 2014-11-03 LAB — D-DIMER, QUANTITATIVE: D-Dimer, Quant: 0.87 ug/mL-FEU — ABNORMAL HIGH (ref 0.00–0.48)

## 2014-11-03 MED ORDER — IOHEXOL 350 MG/ML SOLN
100.0000 mL | Freq: Once | INTRAVENOUS | Status: AC | PRN
  Administered 2014-11-03: 100 mL via INTRAVENOUS

## 2014-11-03 MED ORDER — IBUPROFEN 200 MG PO TABS
600.0000 mg | ORAL_TABLET | Freq: Once | ORAL | Status: AC
  Administered 2014-11-03: 600 mg via ORAL
  Filled 2014-11-03: qty 3

## 2014-11-03 MED ORDER — OXYCODONE-ACETAMINOPHEN 5-325 MG PO TABS
1.0000 | ORAL_TABLET | Freq: Once | ORAL | Status: AC
  Administered 2014-11-03: 1 via ORAL
  Filled 2014-11-03: qty 1

## 2014-11-03 MED ORDER — IBUPROFEN 600 MG PO TABS: 600.0000 mg | ORAL_TABLET | Freq: Three times a day (TID) | ORAL | Status: DC

## 2014-11-03 NOTE — ED Notes (Signed)
Patient states she had a sudden on set of left upper abdominal pain that radiates into the back. Patient denies any N/V/D.

## 2014-11-03 NOTE — ED Provider Notes (Signed)
CSN: [id number]     Arrival date & time 11/03/14  1124 History   First MD Initiated Contact with Patient 11/03/14 1200     Chief Complaint  Patient presents with  . Abdominal Pain     (Consider location/radiation/quality/duration/timing/severity/associated sxs/prior Treatment) HPI Comments: Pt comes in with c/o left lower chest pain and sob. States that it came on acutely thru the night. She feels like she can't take a deep breathe. The pain wraps around to her back. No vomiting, fever, cough. Abdominal pain. Pain is worse with movement.  The history is provided by the patient. No language interpreter was used.    Past Medical History  Diagnosis Date  . Hypertension   . Heart murmur   . Dysfunctional uterine bleeding    Past Surgical History  Procedure Laterality Date  . Cesarean section     Family History  Problem Relation Age of Onset  . Heart failure Father    History  Substance Use Topics  . Smoking status: Current Every Day Smoker -- 0.14 packs/day    Types: Cigarettes    Last Attempt to Quit: 09/04/2011  . Smokeless tobacco: Never Used  . Alcohol Use: No   OB History    No data available     Review of Systems  All other systems reviewed and are negative.     Allergies  Tramadol  Home Medications   Prior to Admission medications   Medication Sig Start Date End Date Taking? Authorizing Provider  acetaminophen (TYLENOL) 325 MG tablet Take 650 mg by mouth every 6 (six) hours as needed for moderate pain. For pain   Yes Historical Provider, MD  Multiple Vitamins-Minerals (ALIVE WOMENS ENERGY PO) Take 1 tablet by mouth daily.   Yes Historical Provider, MD  medroxyPROGESTERone (PROVERA) 5 MG tablet Take 1 tablet (5 mg total) by mouth daily. 12/06/11 12/05/12  Catherine Schinlever, PA-C  ondansetron (ZOFRAN) 4 MG tablet Take 1 tablet (4 mg total) by mouth every 6 (six) hours. Prn n/v Patient not taking: Reported on 11/03/2014 04/28/13   Linna Hoff, MD  oseltamivir  (TAMIFLU) 75 MG capsule Take 1 capsule (75 mg total) by mouth every 12 (twelve) hours. Take all of medication. Patient not taking: Reported on 11/03/2014 04/28/13   Linna Hoff, MD   BP 125/70 mmHg  Pulse 62  Temp(Src) 98.4 F (36.9 C) (Oral)  Resp 16  SpO2 98%  LMP 10/14/2014 Physical Exam  Constitutional: She is oriented to person, place, and time. She appears well-developed and well-nourished.  HENT:  Head: Normocephalic and atraumatic.  Cardiovascular:  Murmur heard. Pulmonary/Chest: Effort normal and breath sounds normal.  Tender in the left lower chest wall  Abdominal: Soft. Bowel sounds are normal. There is no tenderness.  Musculoskeletal: Normal range of motion.  Neurological: She is alert and oriented to person, place, and time.  Skin: Skin is warm and dry. No rash noted.  Psychiatric: She has a normal mood and affect.  Nursing note and vitals reviewed.   ED Course  Procedures (including critical care time) Labs Review Labs Reviewed  COMPREHENSIVE METABOLIC PANEL - Abnormal; Notable for the following:    Glucose, Bld 106 (*)    Creatinine, Ser 1.02 (*)    Anion gap 3 (*)    All other components within normal limits  URINALYSIS, ROUTINE W REFLEX MICROSCOPIC (NOT AT [facility]) - Abnormal; Notable for the following:    APPearance CLOUDY (*)    All other components within normal limits  D-DIMER, QUANTITATIVE (NOT AT [facility]) - Abnormal; Notable for the following:    D-Dimer, Quant 0.87 (*)    All other components within normal limits  LIPASE, BLOOD  CBC  I-STAT BETA HCG BLOOD, ED ([facility], [facility], [facility] ONLY)    Imaging Review Dg Chest 2 View  11/03/2014   CLINICAL DATA:  Acute onset chest pain  EXAM: CHEST  2 VIEW  COMPARISON:  May 09, 2008  FINDINGS: Lungs are clear. The heart size and pulmonary vascularity are normal. No adenopathy. No pneumothorax. No bone lesions.  IMPRESSION: No abnormality noted.   Electronically Signed   By: Bretta Bang III M.D.   On: 11/03/2014 14:22    Ct Angio Chest Pe W/cm &/or Wo Cm  11/03/2014   CLINICAL DATA:  Chest pain and shortness of breath  EXAM: CT ANGIOGRAPHY CHEST WITH CONTRAST  TECHNIQUE: Multidetector CT imaging of the chest was performed using the standard protocol during bolus administration of intravenous contrast. Multiplanar CT image reconstructions and MIPs were obtained to evaluate the vascular anatomy.  CONTRAST:  OMNIPAQUE IOHEXOL 350 MG/ML SOLN  COMPARISON:  Chest radiograph November 03, 2014  FINDINGS: There is no demonstrable pulmonary embolus. There is no thoracic aortic aneurysm or dissection. The visualized great vessels appear normal.  Lungs are clear. Thyroid appears normal. There is no appreciable thoracic adenopathy. Mild residual thymic tissue is normal in this age group. There is no pericardial thickening.  Visualized upper abdominal structures appear unremarkable.  There are no blastic or lytic bone lesions.  Review of the MIP images confirms the above findings.  IMPRESSION: No demonstrable pulmonary embolus. Lungs clear. No appreciable adenopathy.   Electronically Signed   By: Bretta Bang III M.D.   On: 11/03/2014 15:24     EKG Interpretation   Date/Time:  Friday November 03 2014 12:20:44 EDT Ventricular Rate:  56 PR Interval:  144 QRS Duration: 71 QT Interval:  392 QTC Calculation: 378 R Axis:   77 Text Interpretation:  Sinus rhythm ST elevation suggests acute  pericarditis Baseline wander in lead(s) V4 No significant change was found  Confirmed by CAMPOS  MD, KEVIN ([id number]) on 11/03/2014 3:26:03 PM      MDM   Final diagnoses:  Pericarditis    Pt feeling better after hydrocodone here. Will send home with ibuprofen and cardiology follow up. Pt is ekg suggestive or pericaditis. No pericardial effusion noted on ct scan. No sign of pe    Teressa Lower, NP 11/03/14 1633  Azalia Bilis, MD 11/03/14 [phone]

## 2014-11-03 NOTE — ED Notes (Signed)
Delay lab draw NP in with pt

## 2014-11-03 NOTE — Discharge Instructions (Signed)
Pericarditis °Pericarditis is swelling (inflammation) of the pericardium. The pericardium is a thin, double-layered, fluid-filled tissue sac that surrounds the heart. The purpose of the pericardium is to contain the heart in the chest cavity and keep the heart from overexpanding. Different types of pericarditis can occur, such as: °· Acute pericarditis. Inflammation can develop suddenly in acute pericarditis. °· Chronic pericarditis. Inflammation develops gradually and is long-lasting in chronic pericarditis. °· Constrictive pericarditis. In this type of pericarditis, the layers of the pericardium stiffen and develop scar tissue. The scar tissue thickens and sticks together. This makes it difficult for the heart to pump and work as it normally does. °CAUSES  °Pericarditis can be caused from different conditions, such as: °· A bacterial, fungal or viral infection. °· After a heart attack (myocardial infarction). °· After open-heart surgery (coronary bypass graft surgery). °· Auto-immune conditions such as lupus, rheumatoid arthritis or scleroderma. °· Kidney failure. °· Low thyroid condition (hypothyroidism). °· Cancer from another part of the body that has spread (metastasized) to the pericardium. °· Chest injury or trauma. °· After radiation treatment. °· Certain medicines. °SYMPTOMS  °Symptoms of pericarditis can include: °· Chest pain. Chest pain symptoms may increase when laying down and may be relieved when sitting up and leaning forward. °· A chronic, dry cough. °· Heart palpitations. These may feel like rapid, fluttering or pounding heart beats. °· Chest pain may be worse when swallowing. °· Dizziness or fainting. °· Tiredness, fatigue or lethargy. °· Fever. °DIAGNOSIS  °Pericarditis is diagnosed by the following: °· A physical exam. A heart sound called a pericardial friction rub may be heard when your caregiver listens to your heart. °· Blood work. Blood may be drawn to check for an infection and to look at  your blood chemistry. °· Electrocardiography. During electrocardiography your heart's electrical activity is monitored and recorded with a tracing on paper (electrocardiogram [ECG]). °· Echocardiography. °· Computed tomography (CT). °· Magnetic resonance image (MRI). °TREATMENT  °To treat pericarditis, it is important to know the cause of it. The cause of pericarditis determines the treatment.  °· If the cause of pericarditis is due to an infection, treatment is based on the type of infection. If an infection is suspected in the pericardial fluid, a procedure called a pericardial fluid culture and biopsy may be done. This takes a sample of the pericardial fluid. The sample is sent to a lab which runs tests on the pericardial fluid to check for an infection. °· If the autoimmune disease is the cause, treatment of the autoimmune condition will help improve the pericarditis. °· If the cause of pericarditis is not known, anti-inflammatory medicines may be used to help decrease the inflammation. °· Surgery may be needed. The following are types of surgeries or procedures that may be done to treat pericarditis: °¨ Pericardial window. A pericardial window makes a cut (incision) into the pericardial sac. This allows excess fluid in the pericardium to drain. °¨ Pericardiocentesis. A pericardiocentesis is also known as a pericardial tap. This procedure uses a needle that is guided by X-ray to drain (aspirate) excess fluid from the pericardium. °¨ Pericardiectomy. A pericardiectomy removes part or all of the pericardium. °HOME CARE INSTRUCTIONS  °· Do not smoke. If you smoke, quit. Your caregiver can help you quit smoking. °· Maintain a healthy weight. °· Follow an exercise program as told by your caregiver. °· If you drink alcohol, do so in moderation. °· Eat a heart healthy diet. A registered dietician can help you learn about   healthy food choices. °· Keep a list of all your medicines with you at all times. Include the name,  dose, how often it is taken and how it is taken. °SEEK IMMEDIATE MEDICAL CARE IF:  °· You have chest pain or feelings of chest pressure. °· You have sweating (diaphoresis) when at rest. °· You have irregular heartbeats (palpitations). °· You have rapid, racing heart beats. °· You have unexplained fainting episodes. °· You feel sick to your stomach (nausea) or vomiting without cause. °· You have unexplained weakness. °If you develop any of the symptoms which originally made you seek care, call for local emergency medical help. Do not drive yourself to the hospital. °Document Released: 09/10/2000 Document Revised: 06/09/2011 Document Reviewed: 03/19/2011 °ExitCare® Patient Information ©2015 [employer]. This information is not intended to replace advice given to you by your health care provider. Make sure you discuss any questions you have with your health care provider. ° °

## 2014-11-03 NOTE — Progress Notes (Addendum)
pcp listed per [facility] access response history is [facility] [address] [place], [place] [address] [phone]  During assessment of pt she states the pcp listed on the medicaid card is for "my son' Adrianah states she does not believe she is covered by [employer] access any longer Also reports through her job which recently went from temporary to [employer] she will be obtaining insurance coverage "as soon as I complete the paperwork" Cm discussed the importance of completing her forms and that most coverage are effective at the first of most months or certain months of the year depending on her job guidelines. She is hoping for insurance coverage soon She also states she recalls receiving paperwork previously for a discount on [facility] but did not complete the process., Cm discussed with her that it was important to complete the previous paperwork that would have helped her with her previous [facility] encounter's bill.  Cm encouraged pt to speak with a registration staff member prior to leaving ED today about forms for today's visit prn.   CM spoke with pt who confirms uninsured [employer] resident with no pcp.  CM discussed and provided written information for uninsured accepting pcps, discussed the importance of pcp vs EDP services for f/u care, www.needymeds.org, www.goodrx.com, discounted pharmacies and other [employer] such as [facility] , [facility], affordable care act, financial assistance, uninsured dental services, [place] med assist, DSS and  health department  Reviewed resources for [employer] uninsured accepting pcps like [facility], family medicine at [facility], [facility], [facility], local urgent care centers, [facility], [facility] family practice, general medical clinics, family services of the [place], [facility] urgent care plus others, medication resources, [facility] out patient pharmacies and housing Pt voiced  understanding and appreciation of resources provided   Provided [facility] contact information Pt agreed to a referral Cm completed referral Pt to be contact by [facility] clinical liasion Pt verified her address and contact number as correct in EPIC Pt can not recall completing affordable care act process nor orange card process in past

## 2016-06-29 ENCOUNTER — Other Ambulatory Visit (HOSPITAL_COMMUNITY): Payer: Self-pay

## 2016-06-29 ENCOUNTER — Encounter (HOSPITAL_COMMUNITY): Payer: Self-pay | Admitting: Emergency Medicine

## 2016-06-29 ENCOUNTER — Inpatient Hospital Stay (HOSPITAL_COMMUNITY)
Admission: EM | Admit: 2016-06-29 | Discharge: 2016-07-02 | DRG: 392 | Disposition: A | Discharge status: Home / Self Care | Payer: 59 | Attending: Internal Medicine | Admitting: Internal Medicine

## 2016-06-29 ENCOUNTER — Emergency Department (HOSPITAL_COMMUNITY): Payer: 59

## 2016-06-29 DIAGNOSIS — A084 Viral intestinal infection, unspecified: Secondary | ICD-10-CM | POA: Diagnosis not present

## 2016-06-29 DIAGNOSIS — Z794 Long term (current) use of insulin: Secondary | ICD-10-CM

## 2016-06-29 DIAGNOSIS — R1013 Epigastric pain: Secondary | ICD-10-CM

## 2016-06-29 DIAGNOSIS — R112 Nausea with vomiting, unspecified: Secondary | ICD-10-CM

## 2016-06-29 DIAGNOSIS — R1110 Vomiting, unspecified: Secondary | ICD-10-CM | POA: Diagnosis present

## 2016-06-29 DIAGNOSIS — F17210 Nicotine dependence, cigarettes, uncomplicated: Secondary | ICD-10-CM | POA: Diagnosis present

## 2016-06-29 DIAGNOSIS — E1365 Other specified diabetes mellitus with hyperglycemia: Secondary | ICD-10-CM

## 2016-06-29 DIAGNOSIS — R109 Unspecified abdominal pain: Secondary | ICD-10-CM

## 2016-06-29 DIAGNOSIS — Z79899 Other long term (current) drug therapy: Secondary | ICD-10-CM

## 2016-06-29 DIAGNOSIS — R739 Hyperglycemia, unspecified: Secondary | ICD-10-CM

## 2016-06-29 DIAGNOSIS — I10 Essential (primary) hypertension: Secondary | ICD-10-CM | POA: Diagnosis present

## 2016-06-29 DIAGNOSIS — K5900 Constipation, unspecified: Secondary | ICD-10-CM | POA: Diagnosis present

## 2016-06-29 LAB — COMPREHENSIVE METABOLIC PANEL
ALBUMIN: 4.2 g/dL (ref 3.5–5.0)
ALT: 15 U/L (ref 14–54)
ANION GAP: 8 (ref 5–15)
AST: 27 U/L (ref 15–41)
Alkaline Phosphatase: 89 U/L (ref 38–126)
BUN: 6 mg/dL (ref 6–20)
CALCIUM: 9.6 mg/dL (ref 8.9–10.3)
CHLORIDE: 104 mmol/L (ref 101–111)
CO2: 23 mmol/L (ref 22–32)
Creatinine, Ser: 1 mg/dL (ref 0.44–1.00)
GFR calc Af Amer: >60 mL/min (ref >60)
GFR calc non Af Amer: >60 mL/min (ref >60)
GLUCOSE: 117 mg/dL — AB (ref 65–99)
Potassium: 4.1 mmol/L (ref 3.5–5.1)
SODIUM: 135 mmol/L (ref 135–145)
Total Bilirubin: 0.5 mg/dL (ref 0.3–1.2)
Total Protein: 8.1 g/dL (ref 6.5–8.1)

## 2016-06-29 LAB — PROTIME-INR
INR: 1.11
Prothrombin Time: 14.4 seconds (ref 11.4–15.2)

## 2016-06-29 LAB — RAPID URINE DRUG SCREEN, HOSP PERFORMED
AMPHETAMINES: NOT DETECTED
Barbiturates: NOT DETECTED
Benzodiazepines: NOT DETECTED
Cocaine: NOT DETECTED
OPIATES: NOT DETECTED
TETRAHYDROCANNABINOL: NOT DETECTED

## 2016-06-29 LAB — URINALYSIS, ROUTINE W REFLEX MICROSCOPIC
Bilirubin Urine: NEGATIVE
GLUCOSE, UA: NEGATIVE mg/dL
HGB URINE DIPSTICK: NEGATIVE
Ketones, ur: NEGATIVE mg/dL
Leukocytes, UA: NEGATIVE
Nitrite: NEGATIVE
PH: 7 (ref 5.0–8.0)
Protein, ur: NEGATIVE mg/dL
Specific Gravity, Urine: 1.012 (ref 1.005–1.030)

## 2016-06-29 LAB — CBC
HEMATOCRIT: 34.3 % — AB (ref 36.0–46.0)
Hemoglobin: 11.7 g/dL — ABNORMAL LOW (ref 12.0–15.0)
MCH: 25.7 pg — AB (ref 26.0–34.0)
MCHC: 34.1 g/dL (ref 30.0–36.0)
MCV: 75.4 fL — AB (ref 78.0–100.0)
Platelets: 253 10*3/uL (ref 150–400)
RBC: 4.55 MIL/uL (ref 3.87–5.11)
RDW: 17.6 % — ABNORMAL HIGH (ref 11.5–15.5)
WBC: 6.9 10*3/uL (ref 4.0–10.5)

## 2016-06-29 LAB — I-STAT TROPONIN, ED: Troponin i, poc: 0 ng/mL (ref 0.00–0.08)

## 2016-06-29 LAB — I-STAT BETA HCG BLOOD, ED (MC, WL, AP ONLY): I-stat hCG, quantitative: <5 m[IU]/mL (ref <5)

## 2016-06-29 LAB — LIPASE, BLOOD: LIPASE: 27 U/L (ref 11–51)

## 2016-06-29 LAB — TSH: TSH: 0.263 u[IU]/mL — ABNORMAL LOW (ref 0.350–4.500)

## 2016-06-29 LAB — D-DIMER, QUANTITATIVE (NOT AT ARMC): D DIMER QUANT: 1.08 ug{FEU}/mL — AB (ref 0.00–0.50)

## 2016-06-29 MED ORDER — PANTOPRAZOLE SODIUM 40 MG IV SOLR
40.0000 mg | Freq: Two times a day (BID) | INTRAVENOUS | Status: DC
  Administered 2016-06-29 – 2016-07-01 (×5): 40 mg via INTRAVENOUS
  Filled 2016-06-29 (×5): qty 40

## 2016-06-29 MED ORDER — ONDANSETRON HCL 4 MG/2ML IJ SOLN
4.0000 mg | Freq: Four times a day (QID) | INTRAMUSCULAR | Status: DC | PRN
  Administered 2016-06-29 – 2016-06-30 (×2): 4 mg via INTRAVENOUS
  Filled 2016-06-29 (×3): qty 2

## 2016-06-29 MED ORDER — ONDANSETRON HCL 4 MG PO TABS: 4.0000 mg | ORAL_TABLET | Freq: Four times a day (QID) | ORAL | Status: DC | PRN

## 2016-06-29 MED ORDER — LORAZEPAM 2 MG/ML IJ SOLN
1.0000 mg | Freq: Once | INTRAMUSCULAR | Status: AC
  Administered 2016-06-29: 1 mg via INTRAVENOUS
  Filled 2016-06-29: qty 1

## 2016-06-29 MED ORDER — ONDANSETRON HCL 4 MG/2ML IJ SOLN
4.0000 mg | Freq: Once | INTRAMUSCULAR | Status: AC | PRN
  Administered 2016-06-29: 4 mg via INTRAVENOUS
  Filled 2016-06-29: qty 2

## 2016-06-29 MED ORDER — HYDROCODONE-ACETAMINOPHEN 5-325 MG PO TABS
1.0000 | ORAL_TABLET | ORAL | Status: DC | PRN
  Administered 2016-06-30: 2 via ORAL
  Filled 2016-06-29: qty 2

## 2016-06-29 MED ORDER — ACETAMINOPHEN 650 MG RE SUPP: 650.0000 mg | Freq: Four times a day (QID) | RECTAL | Status: DC | PRN

## 2016-06-29 MED ORDER — PANTOPRAZOLE SODIUM 20 MG PO TBEC: 20.0000 mg | DELAYED_RELEASE_TABLET | Freq: Two times a day (BID) | ORAL | 0 refills | Status: DC

## 2016-06-29 MED ORDER — PROMETHAZINE HCL 25 MG/ML IJ SOLN
12.5000 mg | Freq: Once | INTRAMUSCULAR | Status: AC
  Administered 2016-06-29: 12.5 mg via INTRAVENOUS
  Filled 2016-06-29: qty 1

## 2016-06-29 MED ORDER — GI COCKTAIL ~~LOC~~
30.0000 mL | Freq: Once | ORAL | Status: AC
  Administered 2016-06-29: 30 mL via ORAL
  Filled 2016-06-29: qty 30

## 2016-06-29 MED ORDER — IOPAMIDOL (ISOVUE-370) INJECTION 76%
INTRAVENOUS | Status: AC
  Administered 2016-06-29: 100 mL
  Filled 2016-06-29: qty 100

## 2016-06-29 MED ORDER — TRAZODONE HCL 50 MG PO TABS
25.0000 mg | ORAL_TABLET | Freq: Every day | ORAL | Status: DC
  Administered 2016-06-29 – 2016-07-01 (×3): 25 mg via ORAL
  Filled 2016-06-29 (×3): qty 1

## 2016-06-29 MED ORDER — PROMETHAZINE HCL 25 MG/ML IJ SOLN
12.5000 mg | Freq: Four times a day (QID) | INTRAMUSCULAR | Status: DC | PRN
  Administered 2016-06-30: 12.5 mg via INTRAVENOUS
  Filled 2016-06-29 (×2): qty 1

## 2016-06-29 MED ORDER — ONDANSETRON 4 MG PO TBDP: 4.0000 mg | ORAL_TABLET | Freq: Three times a day (TID) | ORAL | 0 refills | Status: DC | PRN

## 2016-06-29 MED ORDER — DICYCLOMINE HCL 20 MG PO TABS: 20.0000 mg | ORAL_TABLET | Freq: Two times a day (BID) | ORAL | 0 refills | Status: DC

## 2016-06-29 MED ORDER — HEPARIN SODIUM (PORCINE) 5000 UNIT/ML IJ SOLN
5000.0000 [IU] | Freq: Three times a day (TID) | INTRAMUSCULAR | Status: DC
  Administered 2016-06-29 – 2016-07-02 (×8): 5000 [IU] via SUBCUTANEOUS
  Filled 2016-06-29 (×8): qty 1

## 2016-06-29 MED ORDER — MORPHINE SULFATE (PF) 2 MG/ML IV SOLN
1.0000 mg | INTRAVENOUS | Status: DC | PRN
  Administered 2016-06-30 (×2): 2 mg via INTRAVENOUS
  Filled 2016-06-29 (×2): qty 1

## 2016-06-29 MED ORDER — DICYCLOMINE HCL 10 MG PO CAPS
20.0000 mg | ORAL_CAPSULE | Freq: Once | ORAL | Status: AC
  Administered 2016-06-29: 20 mg via ORAL
  Filled 2016-06-29: qty 2

## 2016-06-29 MED ORDER — KCL IN DEXTROSE-NACL 10-5-0.45 MEQ/L-%-% IV SOLN
INTRAVENOUS | Status: DC
  Administered 2016-06-29 – 2016-07-01 (×4): via INTRAVENOUS
  Filled 2016-06-29 (×6): qty 1000

## 2016-06-29 MED ORDER — ACETAMINOPHEN 325 MG PO TABS
650.0000 mg | ORAL_TABLET | Freq: Four times a day (QID) | ORAL | Status: DC | PRN
  Administered 2016-07-01 – 2016-07-02 (×2): 650 mg via ORAL
  Filled 2016-06-29 (×2): qty 2

## 2016-06-29 MED ORDER — SODIUM CHLORIDE 0.9 % IV BOLUS (SEPSIS)
1000.0000 mL | Freq: Once | INTRAVENOUS | Status: AC
  Administered 2016-06-29: 1000 mL via INTRAVENOUS

## 2016-06-29 NOTE — ED Notes (Signed)
Pt returned from CT and placed back on monitor.

## 2016-06-29 NOTE — ED Notes (Signed)
MD made aware that pt continues to vomit large amounts (more than ingested from ginger ale).

## 2016-06-29 NOTE — ED Triage Notes (Signed)
To ED via private vehicle- drove self to hospital-- with c/o nausea and vomiting started yesterday - no apparent trigger - no diarrhea, severe cramping.

## 2016-06-29 NOTE — ED Notes (Signed)
Attempted report 

## 2016-06-29 NOTE — ED Provider Notes (Signed)
[facility]-EMERGENCY DEPT Provider Note   CSN: [id number] Arrival date & time: 06/29/16  0855     History   Chief Complaint Chief Complaint  Patient presents with  . Nausea  . Vomiting    HPI Maria Weaver is a 26 y.o. female.  HPI   Maria Weaver is a 26 y.o. female, with a history of HTN, heart murmur, and pericarditis, presenting to the ED with abdominal pain, chest pain, nausea, and nonbilious nonbloody vomiting beginning yesterday.  Patient states she was lying in bed yesterday morning when she began to have generalized, sharp, 10/10 abdominal pain. She began to vomit with multiple episodes ("too many to count") over the last 24 hours. She developed chest pain last night. Her chest pain is described as a pressure, 10/10, nonradiating. States that her abdominal pain "seems like it has moved into the chest." She has not felt this pain before. Chest pain worsens with vomiting, but not with leaning forward. She has not taken any medications prior to arrival. Denies regular alcohol use (drinks maybe once a month). Denies marijuana or other illicit drug use.   She denies fever/chills, diarrhea, shortness of breath, urinary complaints, hematochezia/melena/hematemesis, or any other complaints.  LMP 06/01/16. Last BM yesterday and hard. Patient has a BM daily.   Past Medical History:  Diagnosis Date  . Dysfunctional uterine bleeding   . Heart murmur   . Hypertension     Patient Active Problem List   Diagnosis Date Noted  . Intractable vomiting 06/29/2016    Past Surgical History:  Procedure Laterality Date  . CESAREAN SECTION      OB History    No data available       Home Medications    Prior to Admission medications   Medication Sig Start Date End Date Taking? Authorizing Provider  dicyclomine (BENTYL) 20 MG tablet Take 1 tablet (20 mg total) by mouth 2 (two) times daily. 06/29/16   Chaye Misch C Adlean Hardeman, PA-C  ibuprofen (ADVIL,MOTRIN) 600 MG tablet Take 1 tablet (600 mg total)  by mouth 3 (three) times daily. Patient not taking: Reported on 06/29/2016 11/03/14   Teressa Lower, NP  medroxyPROGESTERone (PROVERA) 5 MG tablet Take 1 tablet (5 mg total) by mouth daily. Patient not taking: Reported on 06/29/2016 12/06/11 12/05/12  Ruby Cola, PA-C  ondansetron (ZOFRAN ODT) 4 MG disintegrating tablet Take 1 tablet (4 mg total) by mouth every 8 (eight) hours as needed for nausea or vomiting. 06/29/16   Aamina Skiff C Jonah Nestle, PA-C  ondansetron (ZOFRAN) 4 MG tablet Take 1 tablet (4 mg total) by mouth every 6 (six) hours. Prn n/v Patient not taking: Reported on 11/03/2014 04/28/13   Linna Hoff, MD  pantoprazole (PROTONIX) 20 MG tablet Take 1 tablet (20 mg total) by mouth 2 (two) times daily. 06/29/16 07/04/16  Anselm Pancoast, PA-C    Family History Family History  Problem Relation Age of Onset  . Heart failure Father     Social History Social History  Substance Use Topics  . Smoking status: Current Every Day Smoker    Packs/day: 0.14    Types: Cigarettes    Last attempt to quit: 09/04/2011  . Smokeless tobacco: Never Used  . Alcohol use No     Allergies   Tramadol   Review of Systems Review of Systems  Constitutional: Negative for chills, diaphoresis and fever.  Respiratory: Negative for cough and shortness of breath.   Cardiovascular: Positive for chest pain. Negative for palpitations and leg  swelling.  Gastrointestinal: Positive for abdominal pain, nausea and vomiting. Negative for diarrhea.  Genitourinary: Negative for dysuria, hematuria and vaginal discharge.  Musculoskeletal: Negative for back pain.  Skin: Negative for rash.  All other systems reviewed and are negative.    Physical Exam Updated Vital Signs BP 138/79 (BP Location: Right Arm)   Pulse 61   Temp 98.8 F (37.1 C) (Oral)   Resp (!) 24   Ht  (1.676 m)   Wt 81.6 kg   LMP 06/01/2016 (Approximate)   SpO2 100%   BMI 29.05 kg/m   Physical Exam  Constitutional: She appears well-developed and  well-nourished. No distress.  Patient is trembling and appears uncomfortable.   HENT:  Head: Normocephalic and atraumatic.  Eyes: Conjunctivae are normal.  Neck: Neck supple.  Cardiovascular: Normal rate, regular rhythm, normal heart sounds and intact distal pulses.   Pulmonary/Chest: Effort normal and breath sounds normal. No respiratory distress. She exhibits tenderness.  Abdominal: Soft. Bowel sounds are increased. There is generalized tenderness. There is no guarding and no CVA tenderness.  Musculoskeletal: She exhibits no edema.  Lymphadenopathy:    She has no cervical adenopathy.  Neurological: She is alert.  Skin: Skin is warm and dry. She is not diaphoretic.  Psychiatric: Her behavior is normal. Her mood appears anxious.  Nursing note and vitals reviewed.    ED Treatments / Results  Labs (all labs ordered are listed, but only abnormal results are displayed) Labs Reviewed  COMPREHENSIVE METABOLIC PANEL - Abnormal; Notable for the following:       Result Value   Glucose, Bld 117 (*)    All other components within normal limits  CBC - Abnormal; Notable for the following:    Hemoglobin 11.7 (*)    HCT 34.3 (*)    MCV 75.4 (*)    MCH 25.7 (*)    RDW 17.6 (*)    All other components within normal limits  D-DIMER, QUANTITATIVE (NOT AT [facility]) - Abnormal; Notable for the following:    D-Dimer, Quant 1.08 (*)    All other components within normal limits  LIPASE, BLOOD  URINALYSIS, ROUTINE W REFLEX MICROSCOPIC  RAPID URINE DRUG SCREEN, HOSP PERFORMED  I-STAT BETA HCG BLOOD, ED ([facility], [facility], [facility] ONLY)  I-STAT TROPOININ, ED    Hemoglobin  Date Value Ref Range Status  06/29/2016 11.7 (L) 12.0 - 15.0 g/dL Final  16/12/9602 54.0 12.0 - 15.0 g/dL Final  98/01/9146 82.9 (L) 12.0 - 15.0 g/dL Final  56/21/3086 57.8 12.0 - 15.0 g/dL Final    EKG  EKG Interpretation  Date/Time:  Sunday June 29 2016 09:04:34 EDT Ventricular Rate:  61 PR Interval:    QRS Duration: 76 QT  Interval:  388 QTC Calculation: 391 R Axis:   80 Text Interpretation:  Sinus rhythm ST elevation  Reconfirmed by Ranae Palms  MD, DAVID ([id number]) on 06/29/2016 11:19:39 AM Also confirmed by Ranae Palms  MD, DAVID ([id number])  on 06/29/2016 11:28:11 AM Also confirmed by Ranae Palms  MD, DAVID ([id number])  on 06/29/2016 11:30:21 AM       Radiology Dg Chest 2 View  Result Date: 06/29/2016 CLINICAL DATA:  Chest and abdominal pain and cramping. Nausea and vomiting. Symptoms since yesterday. EXAM: CHEST  2 VIEW COMPARISON:  11/03/2014 FINDINGS: The heart size and mediastinal contours are within normal limits. Both lungs are clear. No pleural effusion or pneumothorax. The visualized skeletal structures are unremarkable. IMPRESSION: No active cardiopulmonary disease. Electronically Signed   By: Renard Hamper.D.  On: 06/29/2016 11:01   Ct Angio Chest Pe W Or Wo Contrast  Result Date: 06/29/2016 CLINICAL DATA:  Chest and abdominal pain, vomiting EXAM: CT ANGIOGRAPHY CHEST CT ABDOMEN AND PELVIS WITH CONTRAST TECHNIQUE: Multidetector CT imaging of the chest was performed using the standard protocol during bolus administration of intravenous contrast. Multiplanar CT image reconstructions and MIPs were obtained to evaluate the vascular anatomy. Multidetector CT imaging of the abdomen and pelvis was performed using the standard protocol during bolus administration of intravenous contrast. CONTRAST:  100 mL Isovue 370 IV COMPARISON:  11/03/2014 and previous FINDINGS: CTA CHEST FINDINGS Cardiovascular: Right ventricle nondilated. Satisfactory opacification of pulmonary arteries noted, and there is no evidence of pulmonary emboli. Adequate contrast opacification of the thoracic aorta with no evidence of dissection, aneurysm, or stenosis. There is classic 3-vessel brachiocephalic arch anatomy without proximal stenosis. No significant atheromatous irregularity. No pericardial effusion. Mediastinum/Nodes: No adenopathy localized.  Lungs/Pleura: Lungs are clear. No pleural effusion or pneumothorax. Musculoskeletal: No chest wall abnormality. No acute or significant osseous findings. Review of the MIP images confirms the above findings. CT ABDOMEN and PELVIS FINDINGS Hepatobiliary: No focal liver abnormality is seen. No gallstones, gallbladder wall thickening, or biliary dilatation. Pancreas: Unremarkable. No pancreatic ductal dilatation or surrounding inflammatory changes. Spleen: Normal in size without focal abnormality. Adrenals/Urinary Tract: Adrenal glands are unremarkable. Kidneys are normal, without renal calculi, focal lesion, or hydronephrosis. Bladder is unremarkable. Stomach/Bowel: Stomach and small bowel are decompressed. Appendix not discretely identified. Colon is nondilated. Vascular/Lymphatic: No significant vascular findings are present. No enlarged abdominal or pelvic lymph nodes. Bilateral pelvic phleboliths. Reproductive: Uterus and bilateral adnexa are unremarkable. Other: Trace free pelvic fluid as before, probably physiologic. No free air. Musculoskeletal: No acute or significant osseous findings. Review of the MIP images confirms the above findings. IMPRESSION: 1. Negative for acute PE or thoracic aortic dissection. 2. No acute abdominal process. 3. Electronically Signed   By: Corlis Leak M.D.   On: 06/29/2016 14:51   Ct Abdomen Pelvis W Contrast  Result Date: 06/29/2016 CLINICAL DATA:  Chest and abdominal pain, vomiting EXAM: CT ANGIOGRAPHY CHEST CT ABDOMEN AND PELVIS WITH CONTRAST TECHNIQUE: Multidetector CT imaging of the chest was performed using the standard protocol during bolus administration of intravenous contrast. Multiplanar CT image reconstructions and MIPs were obtained to evaluate the vascular anatomy. Multidetector CT imaging of the abdomen and pelvis was performed using the standard protocol during bolus administration of intravenous contrast. CONTRAST:  100 mL Isovue 370 IV COMPARISON:  11/03/2014 and  previous FINDINGS: CTA CHEST FINDINGS Cardiovascular: Right ventricle nondilated. Satisfactory opacification of pulmonary arteries noted, and there is no evidence of pulmonary emboli. Adequate contrast opacification of the thoracic aorta with no evidence of dissection, aneurysm, or stenosis. There is classic 3-vessel brachiocephalic arch anatomy without proximal stenosis. No significant atheromatous irregularity. No pericardial effusion. Mediastinum/Nodes: No adenopathy localized. Lungs/Pleura: Lungs are clear. No pleural effusion or pneumothorax. Musculoskeletal: No chest wall abnormality. No acute or significant osseous findings. Review of the MIP images confirms the above findings. CT ABDOMEN and PELVIS FINDINGS Hepatobiliary: No focal liver abnormality is seen. No gallstones, gallbladder wall thickening, or biliary dilatation. Pancreas: Unremarkable. No pancreatic ductal dilatation or surrounding inflammatory changes. Spleen: Normal in size without focal abnormality. Adrenals/Urinary Tract: Adrenal glands are unremarkable. Kidneys are normal, without renal calculi, focal lesion, or hydronephrosis. Bladder is unremarkable. Stomach/Bowel: Stomach and small bowel are decompressed. Appendix not discretely identified. Colon is nondilated. Vascular/Lymphatic: No significant vascular findings are present. No enlarged abdominal  or pelvic lymph nodes. Bilateral pelvic phleboliths. Reproductive: Uterus and bilateral adnexa are unremarkable. Other: Trace free pelvic fluid as before, probably physiologic. No free air. Musculoskeletal: No acute or significant osseous findings. Review of the MIP images confirms the above findings. IMPRESSION: 1. Negative for acute PE or thoracic aortic dissection. 2. No acute abdominal process. 3. Electronically Signed   By: Corlis Leak M.D.   On: 06/29/2016 14:51   Dg Abd 2 Views  Result Date: 06/29/2016 CLINICAL DATA:  Chest abdominal pain with nausea and vomiting since yesterday. EXAM:  ABDOMEN - 2 VIEW COMPARISON:  None. FINDINGS: The bowel gas pattern is normal. There is no evidence of free air. No radio-opaque calculi or other significant radiographic abnormality is seen. IMPRESSION: Negative. Electronically Signed   By: Amie Portland M.D.   On: 06/29/2016 11:01    Procedures Procedures (including critical care time)  Medications Ordered in ED Medications  promethazine (PHENERGAN) injection 12.5 mg (not administered)  ondansetron (ZOFRAN) injection 4 mg (4 mg Intravenous Given 06/29/16 0941)  dicyclomine (BENTYL) capsule 20 mg (20 mg Oral Given 06/29/16 1113)  gi cocktail (Maalox,Lidocaine,Donnatal) (30 mLs Oral Given 06/29/16 1113)  sodium chloride 0.9 % bolus 1,000 mL (0 mLs Intravenous Stopped 06/29/16 1030)  LORazepam (ATIVAN) injection 1 mg (1 mg Intravenous Given 06/29/16 1113)  iopamidol (ISOVUE-370) 76 % injection (100 mLs  Contrast Given 06/29/16 1421)     Initial Impression / Assessment and Plan / ED Course  I have reviewed the triage vital signs and the nursing notes.  Pertinent labs & imaging results that were available during my care of the patient were reviewed by me and considered in my medical decision making (see chart for details).      Low suspicion for ACS. HEART score is 1, indicating low risk for a cardiac event. Wells criteria score is 0, indicating low risk for PE, however, patient has a positive d-dimer.  EKG shows no significant change from 2016.   Patient initially voices resolution in her nausea and vomiting and significant improvement in her pain, now down to 4/10, following Ativan. Patient is calm and resting comfortably on the bed.   CT scans without acute abnormality. Patient began to vomit again and could not pass an oral fluid challenge. Patient will need to be admitted for intractable vomiting.   Findings and plan of care discussed with Loren Racer, MD. Dr. Ranae Palms personally evaluated and examined this patient.  Vitals:    06/29/16 1519 06/29/16 1521 06/29/16 1530 06/29/16 1545  BP: 137/89  (!) 139/96 (!) 129/92  Pulse:  74 69 76  Resp:  19 (!) 23 16  Temp:      TempSrc:      SpO2:  100% 98% 98%  Weight:      Height:         Final Clinical Impressions(s) / ED Diagnoses   Final diagnoses:  Epigastric pain  Intractable vomiting, presence of nausea not specified, unspecified vomiting type    New Prescriptions New Prescriptions   DICYCLOMINE (BENTYL) 20 MG TABLET    Take 1 tablet (20 mg total) by mouth 2 (two) times daily.   ONDANSETRON (ZOFRAN ODT) 4 MG DISINTEGRATING TABLET    Take 1 tablet (4 mg total) by mouth every 8 (eight) hours as needed for nausea or vomiting.   PANTOPRAZOLE (PROTONIX) 20 MG TABLET    Take 1 tablet (20 mg total) by mouth 2 (two) times daily.     Anselm Pancoast, PA-C  06/29/16 1657    Loren Racer, MD 07/02/16 [phone]

## 2016-06-29 NOTE — ED Notes (Signed)
Called CT to notify pt ready for scans

## 2016-06-29 NOTE — ED Notes (Signed)
Gave pt ginger ale.  

## 2016-06-29 NOTE — H&P (Signed)
History and Physical    CARAGH GASPER ZOX:[id number] DOB: [birth year] DOA: 06/29/2016  PCP: No PCP Per Patient  Patient coming from: Home  Chief Complaint: Intractable nausea and vomiting  HPI: Maria Weaver is a 26 y.o. female with no significant past medical history came to the hospital complaining about abdominal pain, nausea and vomiting. Patient reported her symptoms started 1 day prior to admission at 7 PM with some nausea followed by epigastric abdominal pain and multiple bouts of vomiting, denies any blood in the vomiting, denies any fever or chills although she felt warm. She had constipation for 2 days, before that reports that she is always regular. Denies sick contact, ate out at [employer] on Friday. In the ED multiple antiemetics tried and challenge with clear liquids, she had multiple bouts of vomiting. Referred for admission  ED Course:  Vitals: WNL Labs: WNL except slightly elevated d-dimer at 1.08. Imaging: CT angio and CT abdomen negative Interventions: Multiple antiemetics without success.  Review of Systems:  Constitutional: negative for anorexia, fevers and sweats Eyes: negative for irritation, redness and visual disturbance Ears, nose, mouth, throat, and face: negative for earaches, epistaxis, nasal congestion and sore throat Respiratory: negative for cough, dyspnea on exertion, sputum and wheezing Cardiovascular: negative for chest pain, dyspnea, lower extremity edema, orthopnea, palpitations and syncope Gastrointestinal: Per history of present illness, positive for abdominal pain, constipation, nausea and vomiting. Genitourinary:negative for dysuria, frequency and hematuria Hematologic/lymphatic: negative for bleeding, easy bruising and lymphadenopathy Musculoskeletal:negative for arthralgias, muscle weakness and stiff joints Neurological: negative for coordination problems, gait problems, headaches and weakness Endocrine: negative for diabetic  symptoms including polydipsia, polyuria and weight loss Allergic/Immunologic: negative for anaphylaxis, hay fever and urticaria  Past Medical History:  Diagnosis Date  . Dysfunctional uterine bleeding   . Heart murmur   . Hypertension     Past Surgical History:  Procedure Laterality Date  . CESAREAN SECTION       reports that she has been smoking Cigarettes.  She has been smoking about 0.14 packs per day. She has never used smokeless tobacco. She reports that she does not drink alcohol or use drugs.  Allergies  Allergen Reactions  . Tramadol Rash    Family History  Problem Relation Age of Onset  . Heart failure Father     Prior to Admission medications   Medication Sig Start Date End Date Taking? Authorizing Provider  dicyclomine (BENTYL) 20 MG tablet Take 1 tablet (20 mg total) by mouth 2 (two) times daily. 06/29/16   Shawn C Joy, PA-C  ibuprofen (ADVIL,MOTRIN) 600 MG tablet Take 1 tablet (600 mg total) by mouth 3 (three) times daily. Patient not taking: Reported on 06/29/2016 11/03/14   Teressa Lower, NP  medroxyPROGESTERone (PROVERA) 5 MG tablet Take 1 tablet (5 mg total) by mouth daily. Patient not taking: Reported on 06/29/2016 12/06/11 12/05/12  Ruby Cola, PA-C  ondansetron (ZOFRAN ODT) 4 MG disintegrating tablet Take 1 tablet (4 mg total) by mouth every 8 (eight) hours as needed for nausea or vomiting. 06/29/16   Shawn C Joy, PA-C  ondansetron (ZOFRAN) 4 MG tablet Take 1 tablet (4 mg total) by mouth every 6 (six) hours. Prn n/v Patient not taking: Reported on 11/03/2014 04/28/13   Linna Hoff, MD  pantoprazole (PROTONIX) 20 MG tablet Take 1 tablet (20 mg total) by mouth 2 (two) times daily. 06/29/16 07/04/16  Anselm Pancoast, PA-C    Physical Exam:  Vitals:   06/29/16 1519 06/29/16  1521 06/29/16 1530 06/29/16 1545  BP: 137/89  (!) 139/96 (!) 129/92  Pulse:  74 69 76  Resp:  19 (!) 23 16  Temp:      TempSrc:      SpO2:  100% 98% 98%  Weight:      Height:         Constitutional: NAD, calm, comfortable Eyes: PERRL, lids and conjunctivae normal ENMT: Mucous membranes are moist. Posterior pharynx clear of any exudate or lesions.Normal dentition.  Neck: normal, supple, no masses, no thyromegaly Respiratory: clear to auscultation bilaterally, no wheezing, no crackles. Normal respiratory effort. No accessory muscle use.  Cardiovascular: Regular rate and rhythm, no murmurs / rubs / gallops. No extremity edema. 2+ pedal pulses. No carotid bruits.  Abdomen: no tenderness, no masses palpated. No hepatosplenomegaly. Bowel sounds positive.  Musculoskeletal: no clubbing / cyanosis. No joint deformity upper and lower extremities. Good ROM, no contractures. Normal muscle tone.  Skin: no rashes, lesions, ulcers. No induration Neurologic: CN 2-12 grossly intact. Sensation intact, DTR normal. Strength 5/5 in all 4.  Psychiatric: Normal judgment and insight. Alert and oriented x 3. Normal mood.   Labs on Admission: I have personally reviewed following labs and imaging studies  CBC:  Recent Labs Lab 06/29/16 0926  WBC 6.9  HGB 11.7*  HCT 34.3*  MCV 75.4*  PLT 253   Basic Metabolic Panel:  Recent Labs Lab 06/29/16 0926  NA 135  K 4.1  CL 104  CO2 23  GLUCOSE 117*  BUN 6  CREATININE 1.00  CALCIUM 9.6   GFR: Estimated Creatinine Clearance: 91 mL/min (by C-G formula based on SCr of 1 mg/dL). Liver Function Tests:  Recent Labs Lab 06/29/16 0926  AST 27  ALT 15  ALKPHOS 89  BILITOT 0.5  PROT 8.1  ALBUMIN 4.2    Recent Labs Lab 06/29/16 0926  LIPASE 27   No results for input(s): AMMONIA in the last 168 hours. Coagulation Profile: No results for input(s): INR, PROTIME in the last 168 hours. Cardiac Enzymes: No results for input(s): CKTOTAL, CKMB, CKMBINDEX, TROPONINI in the last 168 hours. BNP (last 3 results) No results for input(s): PROBNP in the last 8760 hours. HbA1C: No results for input(s): HGBA1C in the last 72  hours. CBG: No results for input(s): GLUCAP in the last 168 hours. Lipid Profile: No results for input(s): CHOL, HDL, LDLCALC, TRIG, CHOLHDL, LDLDIRECT in the last 72 hours. Thyroid Function Tests: No results for input(s): TSH, T4TOTAL, FREET4, T3FREE, THYROIDAB in the last 72 hours. Anemia Panel: No results for input(s): VITAMINB12, FOLATE, FERRITIN, TIBC, IRON, RETICCTPCT in the last 72 hours. Urine analysis:    Component Value Date/Time   COLORURINE YELLOW 06/29/2016 1348   APPEARANCEUR CLEAR 06/29/2016 1348   LABSPEC 1.012 06/29/2016 1348   PHURINE 7.0 06/29/2016 1348   GLUCOSEU NEGATIVE 06/29/2016 1348   HGBUR NEGATIVE 06/29/2016 1348   BILIRUBINUR NEGATIVE 06/29/2016 1348   KETONESUR NEGATIVE 06/29/2016 1348   PROTEINUR NEGATIVE 06/29/2016 1348   UROBILINOGEN 0.2 11/03/2014 1318   NITRITE NEGATIVE 06/29/2016 1348   LEUKOCYTESUR NEGATIVE 06/29/2016 1348   Sepsis Labs: !!!!!!!!!!!!!!!!!!!!!!!!!!!!!!!!!!!!!!!!!!!! Invalid input(s): PROCALCITONIN, LACTICIDVEN No results found for this or any previous visit (from the past 240 hour(s)).   Radiological Exams on Admission: Dg Chest 2 View  Result Date: 06/29/2016 CLINICAL DATA:  Chest and abdominal pain and cramping. Nausea and vomiting. Symptoms since yesterday. EXAM: CHEST  2 VIEW COMPARISON:  11/03/2014 FINDINGS: The heart size and mediastinal contours are within  normal limits. Both lungs are clear. No pleural effusion or pneumothorax. The visualized skeletal structures are unremarkable. IMPRESSION: No active cardiopulmonary disease. Electronically Signed   By: Amie Portland M.D.   On: 06/29/2016 11:01   Ct Angio Chest Pe W Or Wo Contrast  Result Date: 06/29/2016 CLINICAL DATA:  Chest and abdominal pain, vomiting EXAM: CT ANGIOGRAPHY CHEST CT ABDOMEN AND PELVIS WITH CONTRAST TECHNIQUE: Multidetector CT imaging of the chest was performed using the standard protocol during bolus administration of intravenous contrast. Multiplanar CT  image reconstructions and MIPs were obtained to evaluate the vascular anatomy. Multidetector CT imaging of the abdomen and pelvis was performed using the standard protocol during bolus administration of intravenous contrast. CONTRAST:  100 mL Isovue 370 IV COMPARISON:  11/03/2014 and previous FINDINGS: CTA CHEST FINDINGS Cardiovascular: Right ventricle nondilated. Satisfactory opacification of pulmonary arteries noted, and there is no evidence of pulmonary emboli. Adequate contrast opacification of the thoracic aorta with no evidence of dissection, aneurysm, or stenosis. There is classic 3-vessel brachiocephalic arch anatomy without proximal stenosis. No significant atheromatous irregularity. No pericardial effusion. Mediastinum/Nodes: No adenopathy localized. Lungs/Pleura: Lungs are clear. No pleural effusion or pneumothorax. Musculoskeletal: No chest wall abnormality. No acute or significant osseous findings. Review of the MIP images confirms the above findings. CT ABDOMEN and PELVIS FINDINGS Hepatobiliary: No focal liver abnormality is seen. No gallstones, gallbladder wall thickening, or biliary dilatation. Pancreas: Unremarkable. No pancreatic ductal dilatation or surrounding inflammatory changes. Spleen: Normal in size without focal abnormality. Adrenals/Urinary Tract: Adrenal glands are unremarkable. Kidneys are normal, without renal calculi, focal lesion, or hydronephrosis. Bladder is unremarkable. Stomach/Bowel: Stomach and small bowel are decompressed. Appendix not discretely identified. Colon is nondilated. Vascular/Lymphatic: No significant vascular findings are present. No enlarged abdominal or pelvic lymph nodes. Bilateral pelvic phleboliths. Reproductive: Uterus and bilateral adnexa are unremarkable. Other: Trace free pelvic fluid as before, probably physiologic. No free air. Musculoskeletal: No acute or significant osseous findings. Review of the MIP images confirms the above findings. IMPRESSION: 1.  Negative for acute PE or thoracic aortic dissection. 2. No acute abdominal process. 3. Electronically Signed   By: Corlis Leak M.D.   On: 06/29/2016 14:51   Ct Abdomen Pelvis W Contrast  Result Date: 06/29/2016 CLINICAL DATA:  Chest and abdominal pain, vomiting EXAM: CT ANGIOGRAPHY CHEST CT ABDOMEN AND PELVIS WITH CONTRAST TECHNIQUE: Multidetector CT imaging of the chest was performed using the standard protocol during bolus administration of intravenous contrast. Multiplanar CT image reconstructions and MIPs were obtained to evaluate the vascular anatomy. Multidetector CT imaging of the abdomen and pelvis was performed using the standard protocol during bolus administration of intravenous contrast. CONTRAST:  100 mL Isovue 370 IV COMPARISON:  11/03/2014 and previous FINDINGS: CTA CHEST FINDINGS Cardiovascular: Right ventricle nondilated. Satisfactory opacification of pulmonary arteries noted, and there is no evidence of pulmonary emboli. Adequate contrast opacification of the thoracic aorta with no evidence of dissection, aneurysm, or stenosis. There is classic 3-vessel brachiocephalic arch anatomy without proximal stenosis. No significant atheromatous irregularity. No pericardial effusion. Mediastinum/Nodes: No adenopathy localized. Lungs/Pleura: Lungs are clear. No pleural effusion or pneumothorax. Musculoskeletal: No chest wall abnormality. No acute or significant osseous findings. Review of the MIP images confirms the above findings. CT ABDOMEN and PELVIS FINDINGS Hepatobiliary: No focal liver abnormality is seen. No gallstones, gallbladder wall thickening, or biliary dilatation. Pancreas: Unremarkable. No pancreatic ductal dilatation or surrounding inflammatory changes. Spleen: Normal in size without focal abnormality. Adrenals/Urinary Tract: Adrenal glands are unremarkable. Kidneys are normal, without  renal calculi, focal lesion, or hydronephrosis. Bladder is unremarkable. Stomach/Bowel: Stomach and small  bowel are decompressed. Appendix not discretely identified. Colon is nondilated. Vascular/Lymphatic: No significant vascular findings are present. No enlarged abdominal or pelvic lymph nodes. Bilateral pelvic phleboliths. Reproductive: Uterus and bilateral adnexa are unremarkable. Other: Trace free pelvic fluid as before, probably physiologic. No free air. Musculoskeletal: No acute or significant osseous findings. Review of the MIP images confirms the above findings. IMPRESSION: 1. Negative for acute PE or thoracic aortic dissection. 2. No acute abdominal process. 3. Electronically Signed   By: Corlis Leak M.D.   On: 06/29/2016 14:51   Dg Abd 2 Views  Result Date: 06/29/2016 CLINICAL DATA:  Chest abdominal pain with nausea and vomiting since yesterday. EXAM: ABDOMEN - 2 VIEW COMPARISON:  None. FINDINGS: The bowel gas pattern is normal. There is no evidence of free air. No radio-opaque calculi or other significant radiographic abnormality is seen. IMPRESSION: Negative. Electronically Signed   By: Amie Portland M.D.   On: 06/29/2016 11:01    EKG: Independently reviewed.   Assessment/Plan Active Problems:   Intractable vomiting    Intractable nausea and vomiting -No previous to give a medical history, came in with 1 day history of nausea and vomiting. -Multiple antiemetics and clear liquid challenge failed to the ED, admitted for intractable vomiting. -CT abdomen negative, has epigastric pain likely from vomiting, lipase negative. -UA negative, UDS pending. -Likely transient viral gastroenteritis, will treat symptomatically with antiemetics and IV fluid hydration. -Started on IVF, keep NPO except sips of fluids with meds.  Epigastric abdominal pain -Reported epigastric abdominal pain, no history of GERD. -Reported pain started before vomiting but got worse after vomiting. -Bowel rest, opioids for pain control.  Elevated d-dimer -Slightly elevated d-dimer at 1.08, CT angio was negative for  PE.   DVT prophylaxis: SQ Heparin Code Status: No Order Family Communication: Plan D/W patient Disposition Plan: Home Consults called:  Admission status: Observation, MedSurg   [facility] A MD [facility] Pager [phone]  If 7PM-7AM, please contact night-coverage [email or web address] Password [facility]  06/29/2016, 5:33 PM

## 2016-06-29 NOTE — ED Notes (Signed)
Pt was able to tolerate ginger ale without vomiting. Pt also able to ambulate in hall. NAD.

## 2016-06-29 NOTE — ED Notes (Signed)
Patient transported to CT 

## 2016-06-29 NOTE — ED Notes (Signed)
Pt not in room upon entering.  

## 2016-06-29 NOTE — ED Notes (Signed)
Pt vomiting, having dry heaves--

## 2016-06-29 NOTE — ED Notes (Signed)
Pt ambulated to the bathroom and back to her room.  Pt tolerated well.  She endorses slight weakness from "feeling sick all over."

## 2016-06-29 NOTE — ED Notes (Signed)
Pt immediately vomited up medication MD made aware.

## 2016-06-29 NOTE — ED Notes (Signed)
ED Provider at bedside. 

## 2016-06-30 ENCOUNTER — Encounter (HOSPITAL_COMMUNITY): Payer: Self-pay | Admitting: General Practice

## 2016-06-30 DIAGNOSIS — F17210 Nicotine dependence, cigarettes, uncomplicated: Secondary | ICD-10-CM | POA: Diagnosis present

## 2016-06-30 DIAGNOSIS — Z79899 Other long term (current) drug therapy: Secondary | ICD-10-CM | POA: Diagnosis not present

## 2016-06-30 DIAGNOSIS — R112 Nausea with vomiting, unspecified: Secondary | ICD-10-CM | POA: Diagnosis not present

## 2016-06-30 DIAGNOSIS — R739 Hyperglycemia, unspecified: Secondary | ICD-10-CM | POA: Diagnosis not present

## 2016-06-30 DIAGNOSIS — K5900 Constipation, unspecified: Secondary | ICD-10-CM | POA: Diagnosis present

## 2016-06-30 DIAGNOSIS — A084 Viral intestinal infection, unspecified: Secondary | ICD-10-CM | POA: Diagnosis present

## 2016-06-30 DIAGNOSIS — I10 Essential (primary) hypertension: Secondary | ICD-10-CM | POA: Diagnosis present

## 2016-06-30 LAB — CBC
HCT: 32 % — ABNORMAL LOW (ref 36.0–46.0)
Hemoglobin: 10.6 g/dL — ABNORMAL LOW (ref 12.0–15.0)
MCH: 25.1 pg — AB (ref 26.0–34.0)
MCHC: 33.1 g/dL (ref 30.0–36.0)
MCV: 75.7 fL — ABNORMAL LOW (ref 78.0–100.0)
PLATELETS: 261 10*3/uL (ref 150–400)
RBC: 4.23 MIL/uL (ref 3.87–5.11)
RDW: 17.8 % — AB (ref 11.5–15.5)
WBC: 7.3 10*3/uL (ref 4.0–10.5)

## 2016-06-30 LAB — HEMOGLOBIN A1C
HEMOGLOBIN A1C: 5.7 % — AB (ref 4.8–5.6)
Mean Plasma Glucose: 117 mg/dL

## 2016-06-30 LAB — BASIC METABOLIC PANEL
ANION GAP: 8 (ref 5–15)
BUN: 5 mg/dL — ABNORMAL LOW (ref 6–20)
CALCIUM: 8.7 mg/dL — AB (ref 8.9–10.3)
CO2: 24 mmol/L (ref 22–32)
CREATININE: 1.03 mg/dL — AB (ref 0.44–1.00)
Chloride: 103 mmol/L (ref 101–111)
GFR calc Af Amer: >60 mL/min (ref >60)
GFR calc non Af Amer: >60 mL/min (ref >60)
GLUCOSE: 142 mg/dL — AB (ref 65–99)
Potassium: 3.8 mmol/L (ref 3.5–5.1)
Sodium: 135 mmol/L (ref 135–145)

## 2016-06-30 LAB — T4, FREE: FREE T4: 0.85 ng/dL (ref 0.61–1.12)

## 2016-06-30 LAB — HIV ANTIBODY (ROUTINE TESTING W REFLEX): HIV SCREEN 4TH GENERATION: NONREACTIVE

## 2016-06-30 MED ORDER — ONDANSETRON HCL 4 MG/2ML IJ SOLN
4.0000 mg | Freq: Four times a day (QID) | INTRAMUSCULAR | Status: DC
  Administered 2016-06-30 – 2016-07-02 (×8): 4 mg via INTRAVENOUS
  Filled 2016-06-30 (×8): qty 2

## 2016-06-30 NOTE — Progress Notes (Signed)
PROGRESS NOTE    Maria Weaver  ZOX:[id number] DOB: 01/17/1991 DOA: 06/29/2016 PCP: No PCP Per Patient   Outpatient Specialists:     Brief Narrative:   Maria Weaver is a 26 y.o. female with no significant past medical history came to the hospital complaining about abdominal pain, nausea and vomiting. Patient reported her symptoms started 1 day prior to admission at 7 PM with some nausea followed by epigastric abdominal pain and multiple bouts of vomiting, denies any blood in the vomiting, denies any fever or chills although she felt warm. She had constipation for 2 days, before that reports that she is always regular. Denies sick contact, ate out at [employer] on Friday. In the ED multiple antiemetics tried and challenge with clear liquids, she had multiple bouts of vomiting.   Assessment & Plan:   Active Problems:   Intractable vomiting   N/V -no diarrhea -patient states she is still not able to tolerate any food PO -schedule zofran -PRN phenergan -NPO until nausea improved -continue IVF -suspect viral etiology  Elevated HgBA1C -will need dietary changed and perhaps PO medications if not better  Low TSH -free t4 normal     DVT prophylaxis:  SQ Heparin  Code Status: Full Code   Family Communication:   Disposition Plan:     Consultants:        Subjective: Asking to take a shower   Objective: Vitals:   06/29/16 1545 06/29/16 1843 06/29/16 2052 06/30/16 0441  BP: (!) 129/92 121/73 121/63 131/77  Pulse: 76 61 72   Resp: Temp:  99.9 F (37.7 C) 100 F (37.8 C) 99.1 F (37.3 C)  TempSrc:  Oral Oral Oral  SpO2: 98% 97% 100% 100%  Weight:      Height:        Intake/Output Summary (Last 24 hours) at 06/30/16 1124 Last data filed at 06/29/16 2052  Gross per 24 hour  Intake                0 ml  Output                0 ml  Net                0 ml   Filed Weights   06/29/16 0907  Weight: 81.6 kg (180 lb)     Examination:  General exam: uncomfortable appearing Respiratory system: Clear to auscultation. Respiratory effort normal. Cardiovascular system: S1 & S2 heard, RRR. No JVD, murmurs, rubs, gallops or clicks. No pedal edema. Gastrointestinal system: Abdomen is nondistended, soft and nontender. No organomegaly or masses felt. Diminished bowel sounds Central nervous system: Alert and oriented. No focal neurological deficits. Extremities: Symmetric 5 x 5 power. Skin: No rashes, lesions or ulcers Psychiatry: flat affect   Data Reviewed: I have personally reviewed following labs and imaging studies  CBC:  Recent Labs Lab 06/29/16 0926 06/30/16 0337  WBC 6.9 7.3  HGB 11.7* 10.6*  HCT 34.3* 32.0*  MCV 75.4* 75.7*  PLT 253 261   Basic Metabolic Panel:  Recent Labs Lab 06/29/16 0926 06/30/16 0337  NA 135 135  K 4.1 3.8  CL 104 103  CO2 23 24  GLUCOSE 117* 142*  BUN 6 5*  CREATININE 1.00 1.03*  CALCIUM 9.6 8.7*   GFR: Estimated Creatinine Clearance: 88.3 mL/min (A) (by C-G formula based on SCr of 1.03 mg/dL (H)). Liver Function Tests:  Recent Labs Lab 06/29/16 [phone]  AST 27  ALT 15  ALKPHOS 89  BILITOT 0.5  PROT 8.1  ALBUMIN 4.2    Recent Labs Lab 06/29/16 0926  LIPASE 27   No results for input(s): AMMONIA in the last 168 hours. Coagulation Profile:  Recent Labs Lab 06/29/16 1917  INR 1.11   Cardiac Enzymes: No results for input(s): CKTOTAL, CKMB, CKMBINDEX, TROPONINI in the last 168 hours. BNP (last 3 results) No results for input(s): PROBNP in the last 8760 hours. HbA1C:  Recent Labs  06/29/16 1917  HGBA1C 5.7*   CBG: No results for input(s): GLUCAP in the last 168 hours. Lipid Profile: No results for input(s): CHOL, HDL, LDLCALC, TRIG, CHOLHDL, LDLDIRECT in the last 72 hours. Thyroid Function Tests:  Recent Labs  06/29/16 1917 06/30/16 0725  TSH 0.263*  --   FREET4  --  0.85   Anemia Panel: No results for input(s): VITAMINB12,  FOLATE, FERRITIN, TIBC, IRON, RETICCTPCT in the last 72 hours. Urine analysis:    Component Value Date/Time   COLORURINE YELLOW 06/29/2016 1348   APPEARANCEUR CLEAR 06/29/2016 1348   LABSPEC 1.012 06/29/2016 1348   PHURINE 7.0 06/29/2016 1348   GLUCOSEU NEGATIVE 06/29/2016 1348   HGBUR NEGATIVE 06/29/2016 1348   BILIRUBINUR NEGATIVE 06/29/2016 1348   KETONESUR NEGATIVE 06/29/2016 1348   PROTEINUR NEGATIVE 06/29/2016 1348   UROBILINOGEN 0.2 11/03/2014 1318   NITRITE NEGATIVE 06/29/2016 1348   LEUKOCYTESUR NEGATIVE 06/29/2016 1348     )No results found for this or any previous visit (from the past 240 hour(s)).    Anti-infectives    None       Radiology Studies: Dg Chest 2 View  Result Date: 06/29/2016 CLINICAL DATA:  Chest and abdominal pain and cramping. Nausea and vomiting. Symptoms since yesterday. EXAM: CHEST  2 VIEW COMPARISON:  11/03/2014 FINDINGS: The heart size and mediastinal contours are within normal limits. Both lungs are clear. No pleural effusion or pneumothorax. The visualized skeletal structures are unremarkable. IMPRESSION: No active cardiopulmonary disease. Electronically Signed   By: Amie Portland M.D.   On: 06/29/2016 11:01   Ct Angio Chest Pe W Or Wo Contrast  Result Date: 06/29/2016 CLINICAL DATA:  Chest and abdominal pain, vomiting EXAM: CT ANGIOGRAPHY CHEST CT ABDOMEN AND PELVIS WITH CONTRAST TECHNIQUE: Multidetector CT imaging of the chest was performed using the standard protocol during bolus administration of intravenous contrast. Multiplanar CT image reconstructions and MIPs were obtained to evaluate the vascular anatomy. Multidetector CT imaging of the abdomen and pelvis was performed using the standard protocol during bolus administration of intravenous contrast. CONTRAST:  100 mL Isovue 370 IV COMPARISON:  11/03/2014 and previous FINDINGS: CTA CHEST FINDINGS Cardiovascular: Right ventricle nondilated. Satisfactory opacification of pulmonary arteries  noted, and there is no evidence of pulmonary emboli. Adequate contrast opacification of the thoracic aorta with no evidence of dissection, aneurysm, or stenosis. There is classic 3-vessel brachiocephalic arch anatomy without proximal stenosis. No significant atheromatous irregularity. No pericardial effusion. Mediastinum/Nodes: No adenopathy localized. Lungs/Pleura: Lungs are clear. No pleural effusion or pneumothorax. Musculoskeletal: No chest wall abnormality. No acute or significant osseous findings. Review of the MIP images confirms the above findings. CT ABDOMEN and PELVIS FINDINGS Hepatobiliary: No focal liver abnormality is seen. No gallstones, gallbladder wall thickening, or biliary dilatation. Pancreas: Unremarkable. No pancreatic ductal dilatation or surrounding inflammatory changes. Spleen: Normal in size without focal abnormality. Adrenals/Urinary Tract: Adrenal glands are unremarkable. Kidneys are normal, without renal calculi, focal lesion, or hydronephrosis. Bladder is unremarkable. Stomach/Bowel: Stomach and small bowel  are decompressed. Appendix not discretely identified. Colon is nondilated. Vascular/Lymphatic: No significant vascular findings are present. No enlarged abdominal or pelvic lymph nodes. Bilateral pelvic phleboliths. Reproductive: Uterus and bilateral adnexa are unremarkable. Other: Trace free pelvic fluid as before, probably physiologic. No free air. Musculoskeletal: No acute or significant osseous findings. Review of the MIP images confirms the above findings. IMPRESSION: 1. Negative for acute PE or thoracic aortic dissection. 2. No acute abdominal process. 3. Electronically Signed   By: Corlis Leak M.D.   On: 06/29/2016 14:51   Ct Abdomen Pelvis W Contrast  Result Date: 06/29/2016 CLINICAL DATA:  Chest and abdominal pain, vomiting EXAM: CT ANGIOGRAPHY CHEST CT ABDOMEN AND PELVIS WITH CONTRAST TECHNIQUE: Multidetector CT imaging of the chest was performed using the standard  protocol during bolus administration of intravenous contrast. Multiplanar CT image reconstructions and MIPs were obtained to evaluate the vascular anatomy. Multidetector CT imaging of the abdomen and pelvis was performed using the standard protocol during bolus administration of intravenous contrast. CONTRAST:  100 mL Isovue 370 IV COMPARISON:  11/03/2014 and previous FINDINGS: CTA CHEST FINDINGS Cardiovascular: Right ventricle nondilated. Satisfactory opacification of pulmonary arteries noted, and there is no evidence of pulmonary emboli. Adequate contrast opacification of the thoracic aorta with no evidence of dissection, aneurysm, or stenosis. There is classic 3-vessel brachiocephalic arch anatomy without proximal stenosis. No significant atheromatous irregularity. No pericardial effusion. Mediastinum/Nodes: No adenopathy localized. Lungs/Pleura: Lungs are clear. No pleural effusion or pneumothorax. Musculoskeletal: No chest wall abnormality. No acute or significant osseous findings. Review of the MIP images confirms the above findings. CT ABDOMEN and PELVIS FINDINGS Hepatobiliary: No focal liver abnormality is seen. No gallstones, gallbladder wall thickening, or biliary dilatation. Pancreas: Unremarkable. No pancreatic ductal dilatation or surrounding inflammatory changes. Spleen: Normal in size without focal abnormality. Adrenals/Urinary Tract: Adrenal glands are unremarkable. Kidneys are normal, without renal calculi, focal lesion, or hydronephrosis. Bladder is unremarkable. Stomach/Bowel: Stomach and small bowel are decompressed. Appendix not discretely identified. Colon is nondilated. Vascular/Lymphatic: No significant vascular findings are present. No enlarged abdominal or pelvic lymph nodes. Bilateral pelvic phleboliths. Reproductive: Uterus and bilateral adnexa are unremarkable. Other: Trace free pelvic fluid as before, probably physiologic. No free air. Musculoskeletal: No acute or significant osseous  findings. Review of the MIP images confirms the above findings. IMPRESSION: 1. Negative for acute PE or thoracic aortic dissection. 2. No acute abdominal process. 3. Electronically Signed   By: Corlis Leak M.D.   On: 06/29/2016 14:51   Dg Abd 2 Views  Result Date: 06/29/2016 CLINICAL DATA:  Chest abdominal pain with nausea and vomiting since yesterday. EXAM: ABDOMEN - 2 VIEW COMPARISON:  None. FINDINGS: The bowel gas pattern is normal. There is no evidence of free air. No radio-opaque calculi or other significant radiographic abnormality is seen. IMPRESSION: Negative. Electronically Signed   By: Amie Portland M.D.   On: 06/29/2016 11:01        Scheduled Meds: . heparin  5,000 Units Subcutaneous Q8H  . ondansetron (ZOFRAN) IV  4 mg Intravenous Q6H  . pantoprazole (PROTONIX) IV  40 mg Intravenous Q12H  . traZODone  25 mg Oral QHS   Continuous Infusions: . dextrose 5 % and 0.45 % NaCl with KCl 10 mEq/L 100 mL/hr at 06/30/16 0704     LOS: 0 days    Time spent: 25 min    Peregrine Nolt U Xyler Terpening, DO [facility] Pager [phone]  If 7PM-7AM, please contact night-coverage [email or web address] Password [facility] 06/30/2016, 11:24 AM

## 2016-07-01 DIAGNOSIS — R739 Hyperglycemia, unspecified: Secondary | ICD-10-CM

## 2016-07-01 LAB — BASIC METABOLIC PANEL
ANION GAP: 5 (ref 5–15)
BUN: 5 mg/dL — AB (ref 6–20)
CALCIUM: 8.5 mg/dL — AB (ref 8.9–10.3)
CO2: 25 mmol/L (ref 22–32)
CREATININE: 1.02 mg/dL — AB (ref 0.44–1.00)
Chloride: 105 mmol/L (ref 101–111)
GFR calc Af Amer: >60 mL/min (ref >60)
GLUCOSE: 113 mg/dL — AB (ref 65–99)
Potassium: 3.6 mmol/L (ref 3.5–5.1)
Sodium: 135 mmol/L (ref 135–145)

## 2016-07-01 LAB — CBC
HCT: 30.3 % — ABNORMAL LOW (ref 36.0–46.0)
Hemoglobin: 10.1 g/dL — ABNORMAL LOW (ref 12.0–15.0)
MCH: 25.4 pg — AB (ref 26.0–34.0)
MCHC: 33.3 g/dL (ref 30.0–36.0)
MCV: 76.1 fL — ABNORMAL LOW (ref 78.0–100.0)
PLATELETS: 236 10*3/uL (ref 150–400)
RBC: 3.98 MIL/uL (ref 3.87–5.11)
RDW: 17.9 % — AB (ref 11.5–15.5)
WBC: 5 10*3/uL (ref 4.0–10.5)

## 2016-07-01 LAB — GLUCOSE, CAPILLARY
GLUCOSE-CAPILLARY: 110 mg/dL — AB (ref 65–99)
GLUCOSE-CAPILLARY: 103 mg/dL — AB (ref 65–99)
GLUCOSE-CAPILLARY: 147 mg/dL — AB (ref 65–99)

## 2016-07-01 MED ORDER — INSULIN ASPART 100 UNIT/ML ~~LOC~~ SOLN
0.0000 [IU] | Freq: Three times a day (TID) | SUBCUTANEOUS | Status: DC
  Administered 2016-07-01: 1 [IU] via SUBCUTANEOUS

## 2016-07-01 MED ORDER — INSULIN ASPART 100 UNIT/ML ~~LOC~~ SOLN: 0.0000 [IU] | Freq: Every day | SUBCUTANEOUS | Status: DC

## 2016-07-01 NOTE — Progress Notes (Signed)
PROGRESS NOTE    Maria Weaver  ZOX:[id number] DOB: 1990/11/07 DOA: 06/29/2016 PCP: No PCP Per Patient   Outpatient Specialists:     Brief Narrative:   Maria Weaver is a 26 y.o. female with no significant past medical history came to the hospital complaining about abdominal pain, nausea and vomiting. Patient reported her symptoms started 1 day prior to admission at 7 PM with some nausea followed by epigastric abdominal pain and multiple bouts of vomiting, denies any blood in the vomiting, denies any fever or chills although she felt warm. She had constipation for 2 days, before that reports that she is always regular. Denies sick contact, ate out at [employer] on Friday. In the ED multiple antiemetics tried and challenge with clear liquids, she had multiple bouts of vomiting.   Assessment & Plan:   Active Problems:   Intractable vomiting   N/V -no diarrhea -able to tolerate sips last PM -scheduled zofran -PRN phenergan -suspect viral etiology -clears and advance as tolerated  Elevated HgBA1C -will need dietary changed and perhaps PO medications if not better as an outpatient -SSI for now  Low TSH -free t4 normal     DVT prophylaxis:  SQ Heparin  Code Status: Full Code   Family Communication:   Disposition Plan:     Consultants:        Subjective: Says she tolerated sips last PM   Objective: Vitals:   06/30/16 0441 06/30/16 1618 06/30/16 2149 07/01/16 0545  BP: 131/77 139/86 110/62 (!) 103/56  Pulse:  66 (!) 58 62  Resp: Temp: 99.1 F (37.3 C) 98.6 F (37 C) 99 F (37.2 C) 98.6 F (37 C)  TempSrc: Oral Oral Oral Oral  SpO2: 100% 100% 98% 96%  Weight:      Height:        Intake/Output Summary (Last 24 hours) at 07/01/16 0832 Last data filed at 07/01/16 0440  Gross per 24 hour  Intake          1208.33 ml  Output                0 ml  Net          1208.33 ml   Filed Weights   06/29/16 0907  Weight: 81.6 kg  (180 lb)    Examination:  General exam: NAD Respiratory system: Clear to auscultation. Respiratory effort normal. Cardiovascular system: S1 & S2 heard, RRR. No JVD, murmurs, rubs, gallops or clicks. No pedal edema. Gastrointestinal system: Abdomen is nondistended, soft and nontender. No organomegaly or masses felt. + bowel sounds Central nervous system: Alert and oriented. No focal neurological deficits.    Data Reviewed: I have personally reviewed following labs and imaging studies  CBC:  Recent Labs Lab 06/29/16 0926 06/30/16 0337 07/01/16 0426  WBC 6.9 7.3 5.0  HGB 11.7* 10.6* 10.1*  HCT 34.3* 32.0* 30.3*  MCV 75.4* 75.7* 76.1*  PLT 253 261 236   Basic Metabolic Panel:  Recent Labs Lab 06/29/16 0926 06/30/16 0337 07/01/16 0426  NA 135 135 135  K 4.1 3.8 3.6  CL 104 103 105  CO2 GLUCOSE 117* 142* 113*  BUN 6 5* 5*  CREATININE 1.00 1.03* 1.02*  CALCIUM 9.6 8.7* 8.5*   GFR: Estimated Creatinine Clearance: 89.2 mL/min (A) (by C-G formula based on SCr of 1.02 mg/dL (H)). Liver Function Tests:  Recent Labs Lab 06/29/16 0926  AST 27  ALT 15  ALKPHOS 89  BILITOT 0.5  PROT 8.1  ALBUMIN 4.2    Recent Labs Lab 06/29/16 0926  LIPASE 27   No results for input(s): AMMONIA in the last 168 hours. Coagulation Profile:  Recent Labs Lab 06/29/16 1917  INR 1.11   Cardiac Enzymes: No results for input(s): CKTOTAL, CKMB, CKMBINDEX, TROPONINI in the last 168 hours. BNP (last 3 results) No results for input(s): PROBNP in the last 8760 hours. HbA1C:  Recent Labs  06/29/16 1917  HGBA1C 5.7*   CBG: No results for input(s): GLUCAP in the last 168 hours. Lipid Profile: No results for input(s): CHOL, HDL, LDLCALC, TRIG, CHOLHDL, LDLDIRECT in the last 72 hours. Thyroid Function Tests:  Recent Labs  06/29/16 1917 06/30/16 0725  TSH 0.263*  --   FREET4  --  0.85   Anemia Panel: No results for input(s): VITAMINB12, FOLATE, FERRITIN, TIBC,  IRON, RETICCTPCT in the last 72 hours. Urine analysis:    Component Value Date/Time   COLORURINE YELLOW 06/29/2016 1348   APPEARANCEUR CLEAR 06/29/2016 1348   LABSPEC 1.012 06/29/2016 1348   PHURINE 7.0 06/29/2016 1348   GLUCOSEU NEGATIVE 06/29/2016 1348   HGBUR NEGATIVE 06/29/2016 1348   BILIRUBINUR NEGATIVE 06/29/2016 1348   KETONESUR NEGATIVE 06/29/2016 1348   PROTEINUR NEGATIVE 06/29/2016 1348   UROBILINOGEN 0.2 11/03/2014 1318   NITRITE NEGATIVE 06/29/2016 1348   LEUKOCYTESUR NEGATIVE 06/29/2016 1348     )No results found for this or any previous visit (from the past 240 hour(s)).    Anti-infectives    None       Radiology Studies: Dg Chest 2 View  Result Date: 06/29/2016 CLINICAL DATA:  Chest and abdominal pain and cramping. Nausea and vomiting. Symptoms since yesterday. EXAM: CHEST  2 VIEW COMPARISON:  11/03/2014 FINDINGS: The heart size and mediastinal contours are within normal limits. Both lungs are clear. No pleural effusion or pneumothorax. The visualized skeletal structures are unremarkable. IMPRESSION: No active cardiopulmonary disease. Electronically Signed   By: Amie Portland M.D.   On: 06/29/2016 11:01   Ct Angio Chest Pe W Or Wo Contrast  Result Date: 06/29/2016 CLINICAL DATA:  Chest and abdominal pain, vomiting EXAM: CT ANGIOGRAPHY CHEST CT ABDOMEN AND PELVIS WITH CONTRAST TECHNIQUE: Multidetector CT imaging of the chest was performed using the standard protocol during bolus administration of intravenous contrast. Multiplanar CT image reconstructions and MIPs were obtained to evaluate the vascular anatomy. Multidetector CT imaging of the abdomen and pelvis was performed using the standard protocol during bolus administration of intravenous contrast. CONTRAST:  100 mL Isovue 370 IV COMPARISON:  11/03/2014 and previous FINDINGS: CTA CHEST FINDINGS Cardiovascular: Right ventricle nondilated. Satisfactory opacification of pulmonary arteries noted, and there is no  evidence of pulmonary emboli. Adequate contrast opacification of the thoracic aorta with no evidence of dissection, aneurysm, or stenosis. There is classic 3-vessel brachiocephalic arch anatomy without proximal stenosis. No significant atheromatous irregularity. No pericardial effusion. Mediastinum/Nodes: No adenopathy localized. Lungs/Pleura: Lungs are clear. No pleural effusion or pneumothorax. Musculoskeletal: No chest wall abnormality. No acute or significant osseous findings. Review of the MIP images confirms the above findings. CT ABDOMEN and PELVIS FINDINGS Hepatobiliary: No focal liver abnormality is seen. No gallstones, gallbladder wall thickening, or biliary dilatation. Pancreas: Unremarkable. No pancreatic ductal dilatation or surrounding inflammatory changes. Spleen: Normal in size without focal abnormality. Adrenals/Urinary Tract: Adrenal glands are unremarkable. Kidneys are normal, without renal calculi, focal lesion, or hydronephrosis. Bladder is unremarkable. Stomach/Bowel: Stomach and small bowel are decompressed. Appendix not discretely identified.  Colon is nondilated. Vascular/Lymphatic: No significant vascular findings are present. No enlarged abdominal or pelvic lymph nodes. Bilateral pelvic phleboliths. Reproductive: Uterus and bilateral adnexa are unremarkable. Other: Trace free pelvic fluid as before, probably physiologic. No free air. Musculoskeletal: No acute or significant osseous findings. Review of the MIP images confirms the above findings. IMPRESSION: 1. Negative for acute PE or thoracic aortic dissection. 2. No acute abdominal process. 3. Electronically Signed   By: Corlis Leak M.D.   On: 06/29/2016 14:51   Ct Abdomen Pelvis W Contrast  Result Date: 06/29/2016 CLINICAL DATA:  Chest and abdominal pain, vomiting EXAM: CT ANGIOGRAPHY CHEST CT ABDOMEN AND PELVIS WITH CONTRAST TECHNIQUE: Multidetector CT imaging of the chest was performed using the standard protocol during bolus  administration of intravenous contrast. Multiplanar CT image reconstructions and MIPs were obtained to evaluate the vascular anatomy. Multidetector CT imaging of the abdomen and pelvis was performed using the standard protocol during bolus administration of intravenous contrast. CONTRAST:  100 mL Isovue 370 IV COMPARISON:  11/03/2014 and previous FINDINGS: CTA CHEST FINDINGS Cardiovascular: Right ventricle nondilated. Satisfactory opacification of pulmonary arteries noted, and there is no evidence of pulmonary emboli. Adequate contrast opacification of the thoracic aorta with no evidence of dissection, aneurysm, or stenosis. There is classic 3-vessel brachiocephalic arch anatomy without proximal stenosis. No significant atheromatous irregularity. No pericardial effusion. Mediastinum/Nodes: No adenopathy localized. Lungs/Pleura: Lungs are clear. No pleural effusion or pneumothorax. Musculoskeletal: No chest wall abnormality. No acute or significant osseous findings. Review of the MIP images confirms the above findings. CT ABDOMEN and PELVIS FINDINGS Hepatobiliary: No focal liver abnormality is seen. No gallstones, gallbladder wall thickening, or biliary dilatation. Pancreas: Unremarkable. No pancreatic ductal dilatation or surrounding inflammatory changes. Spleen: Normal in size without focal abnormality. Adrenals/Urinary Tract: Adrenal glands are unremarkable. Kidneys are normal, without renal calculi, focal lesion, or hydronephrosis. Bladder is unremarkable. Stomach/Bowel: Stomach and small bowel are decompressed. Appendix not discretely identified. Colon is nondilated. Vascular/Lymphatic: No significant vascular findings are present. No enlarged abdominal or pelvic lymph nodes. Bilateral pelvic phleboliths. Reproductive: Uterus and bilateral adnexa are unremarkable. Other: Trace free pelvic fluid as before, probably physiologic. No free air. Musculoskeletal: No acute or significant osseous findings. Review of the  MIP images confirms the above findings. IMPRESSION: 1. Negative for acute PE or thoracic aortic dissection. 2. No acute abdominal process. 3. Electronically Signed   By: Corlis Leak M.D.   On: 06/29/2016 14:51   Dg Abd 2 Views  Result Date: 06/29/2016 CLINICAL DATA:  Chest abdominal pain with nausea and vomiting since yesterday. EXAM: ABDOMEN - 2 VIEW COMPARISON:  None. FINDINGS: The bowel gas pattern is normal. There is no evidence of free air. No radio-opaque calculi or other significant radiographic abnormality is seen. IMPRESSION: Negative. Electronically Signed   By: Amie Portland M.D.   On: 06/29/2016 11:01        Scheduled Meds: . heparin  5,000 Units Subcutaneous Q8H  . ondansetron (ZOFRAN) IV  4 mg Intravenous Q6H  . pantoprazole (PROTONIX) IV  40 mg Intravenous Q12H  . traZODone  25 mg Oral QHS   Continuous Infusions: . dextrose 5 % and 0.45 % NaCl with KCl 10 mEq/L 125 mL/hr at 07/01/16 0230     LOS: 1 day    Time spent: 15 min    Tavita Eastham U Huel Centola, DO [facility] Pager [phone]  If 7PM-7AM, please contact night-coverage [email or web address] Password [facility] 07/01/2016, 8:32 AM

## 2016-07-02 LAB — CBC
HCT: 31 % — ABNORMAL LOW (ref 36.0–46.0)
HEMOGLOBIN: 10.1 g/dL — AB (ref 12.0–15.0)
MCH: 24.9 pg — AB (ref 26.0–34.0)
MCHC: 32.6 g/dL (ref 30.0–36.0)
MCV: 76.5 fL — ABNORMAL LOW (ref 78.0–100.0)
Platelets: 251 10*3/uL (ref 150–400)
RBC: 4.05 MIL/uL (ref 3.87–5.11)
RDW: 17.4 % — ABNORMAL HIGH (ref 11.5–15.5)
WBC: 5.1 10*3/uL (ref 4.0–10.5)

## 2016-07-02 LAB — BASIC METABOLIC PANEL
Anion gap: 6 (ref 5–15)
BUN: 6 mg/dL (ref 6–20)
CO2: 26 mmol/L (ref 22–32)
CREATININE: 1.11 mg/dL — AB (ref 0.44–1.00)
Calcium: 8.7 mg/dL — ABNORMAL LOW (ref 8.9–10.3)
Chloride: 104 mmol/L (ref 101–111)
GFR calc Af Amer: >60 mL/min (ref >60)
GLUCOSE: 97 mg/dL (ref 65–99)
POTASSIUM: 3.7 mmol/L (ref 3.5–5.1)
Sodium: 136 mmol/L (ref 135–145)

## 2016-07-02 LAB — GLUCOSE, CAPILLARY
GLUCOSE-CAPILLARY: 97 mg/dL (ref 65–99)
Glucose-Capillary: 100 mg/dL — ABNORMAL HIGH (ref 65–99)
Glucose-Capillary: 104 mg/dL — ABNORMAL HIGH (ref 65–99)

## 2016-07-02 MED ORDER — ONDANSETRON 4 MG PO TBDP: 4.0000 mg | ORAL_TABLET | Freq: Three times a day (TID) | ORAL | 0 refills | Status: AC | PRN

## 2016-07-02 MED ORDER — ONDANSETRON HCL 4 MG PO TABS: 4.0000 mg | ORAL_TABLET | Freq: Three times a day (TID) | ORAL | Status: DC | PRN

## 2016-07-02 MED ORDER — MAGNESIUM OXIDE 400 (241.3 MG) MG PO TABS
400.0000 mg | ORAL_TABLET | Freq: Every day | ORAL | Status: DC
  Administered 2016-07-02: 400 mg via ORAL
  Filled 2016-07-02: qty 1

## 2016-07-02 MED ORDER — POLYETHYLENE GLYCOL 3350 17 G PO PACK
17.0000 g | PACK | Freq: Every day | ORAL | Status: DC
  Administered 2016-07-02: 17 g via ORAL
  Filled 2016-07-02: qty 1

## 2016-07-02 MED ORDER — BISACODYL 10 MG RE SUPP
10.0000 mg | Freq: Once | RECTAL | Status: AC
  Administered 2016-07-02: 10 mg via RECTAL
  Filled 2016-07-02: qty 1

## 2016-07-02 NOTE — Discharge Summary (Signed)
Physician Discharge Summary  Maria Weaver ZOX:[id number] DOB: 1990-10-23 DOA: 06/29/2016  PCP: No PCP Per Patient  Admit date: 06/29/2016 Discharge date: 07/02/2016   Recommendations for Outpatient Follow-Up:   1. Encourage weight loss and carb mod diet 2. TSH follow up   Discharge Diagnosis:   Active Problems:   Intractable vomiting   Discharge disposition:  Home:  Discharge Condition: Improved.  Diet recommendation: full liquid, advance as tolerated to carb mod diet  Wound care: None.   History of Present Illness:   Maria Weaver is a 26 y.o. female with no significant past medical history came to the hospital complaining about abdominal pain, nausea and vomiting. Patient reported her symptoms started 1 day prior to admission at 7 PM with some nausea followed by epigastric abdominal pain and multiple bouts of vomiting, denies any blood in the vomiting, denies any fever or chills although she felt warm. She had constipation for 2 days, before that reports that she is always regular. Denies sick contact, ate out at [employer] on Friday. In the ED multiple antiemetics tried and challenge with clear liquids, she had multiple bouts of vomiting. Referred for admission   Hospital Course by Problem:   N/V -no diarrhea -able to tolerate liquids-- advance as tolerated -PRN zofran -suspect viral etiology  Elevated HgBA1C -will need dietary changed and perhaps PO medications if not better as an outpatient -SSI for now  Low TSH -free t4 normal    Medical Consultants:    None.   Discharge Exam:   Vitals:   07/01/16 2028 07/02/16 0459  BP: 117/79 102/77  Pulse: (!) 53 (!) 58  Resp: 18 16  Temp: 99.1 F (37.3 C) 99 F (37.2 C)   Vitals:   07/01/16 0545 07/01/16 1320 07/01/16 2028 07/02/16 0459  BP: (!) 103/56 124/77 117/79 102/77  Pulse: 62 (!) 54 (!) 53 (!) 58  Resp: Temp: 98.6 F (37 C) 98.6 F (37 C) 99.1 F (37.3 C) 99 F  (37.2 C)  TempSrc: Oral Oral Oral Oral  SpO2: 96% 100% 100% 100%  Weight:      Height:        Gen:  NAD- tolerating clear diet   The results of significant diagnostics from this hospitalization (including imaging, microbiology, ancillary and laboratory) are listed below for reference.     Procedures and Diagnostic Studies:   Dg Chest 2 View  Result Date: 06/29/2016 CLINICAL DATA:  Chest and abdominal pain and cramping. Nausea and vomiting. Symptoms since yesterday. EXAM: CHEST  2 VIEW COMPARISON:  11/03/2014 FINDINGS: The heart size and mediastinal contours are within normal limits. Both lungs are clear. No pleural effusion or pneumothorax. The visualized skeletal structures are unremarkable. IMPRESSION: No active cardiopulmonary disease. Electronically Signed   By: Amie Portland M.D.   On: 06/29/2016 11:01   Ct Angio Chest Pe W Or Wo Contrast  Result Date: 06/29/2016 CLINICAL DATA:  Chest and abdominal pain, vomiting EXAM: CT ANGIOGRAPHY CHEST CT ABDOMEN AND PELVIS WITH CONTRAST TECHNIQUE: Multidetector CT imaging of the chest was performed using the standard protocol during bolus administration of intravenous contrast. Multiplanar CT image reconstructions and MIPs were obtained to evaluate the vascular anatomy. Multidetector CT imaging of the abdomen and pelvis was performed using the standard protocol during bolus administration of intravenous contrast. CONTRAST:  100 mL Isovue 370 IV COMPARISON:  11/03/2014 and previous FINDINGS: CTA CHEST FINDINGS Cardiovascular: Right ventricle nondilated. Satisfactory opacification of pulmonary  arteries noted, and there is no evidence of pulmonary emboli. Adequate contrast opacification of the thoracic aorta with no evidence of dissection, aneurysm, or stenosis. There is classic 3-vessel brachiocephalic arch anatomy without proximal stenosis. No significant atheromatous irregularity. No pericardial effusion. Mediastinum/Nodes: No adenopathy localized.  Lungs/Pleura: Lungs are clear. No pleural effusion or pneumothorax. Musculoskeletal: No chest wall abnormality. No acute or significant osseous findings. Review of the MIP images confirms the above findings. CT ABDOMEN and PELVIS FINDINGS Hepatobiliary: No focal liver abnormality is seen. No gallstones, gallbladder wall thickening, or biliary dilatation. Pancreas: Unremarkable. No pancreatic ductal dilatation or surrounding inflammatory changes. Spleen: Normal in size without focal abnormality. Adrenals/Urinary Tract: Adrenal glands are unremarkable. Kidneys are normal, without renal calculi, focal lesion, or hydronephrosis. Bladder is unremarkable. Stomach/Bowel: Stomach and small bowel are decompressed. Appendix not discretely identified. Colon is nondilated. Vascular/Lymphatic: No significant vascular findings are present. No enlarged abdominal or pelvic lymph nodes. Bilateral pelvic phleboliths. Reproductive: Uterus and bilateral adnexa are unremarkable. Other: Trace free pelvic fluid as before, probably physiologic. No free air. Musculoskeletal: No acute or significant osseous findings. Review of the MIP images confirms the above findings. IMPRESSION: 1. Negative for acute PE or thoracic aortic dissection. 2. No acute abdominal process. 3. Electronically Signed   By: Corlis Leak M.D.   On: 06/29/2016 14:51   Ct Abdomen Pelvis W Contrast  Result Date: 06/29/2016 CLINICAL DATA:  Chest and abdominal pain, vomiting EXAM: CT ANGIOGRAPHY CHEST CT ABDOMEN AND PELVIS WITH CONTRAST TECHNIQUE: Multidetector CT imaging of the chest was performed using the standard protocol during bolus administration of intravenous contrast. Multiplanar CT image reconstructions and MIPs were obtained to evaluate the vascular anatomy. Multidetector CT imaging of the abdomen and pelvis was performed using the standard protocol during bolus administration of intravenous contrast. CONTRAST:  100 mL Isovue 370 IV COMPARISON:  11/03/2014 and  previous FINDINGS: CTA CHEST FINDINGS Cardiovascular: Right ventricle nondilated. Satisfactory opacification of pulmonary arteries noted, and there is no evidence of pulmonary emboli. Adequate contrast opacification of the thoracic aorta with no evidence of dissection, aneurysm, or stenosis. There is classic 3-vessel brachiocephalic arch anatomy without proximal stenosis. No significant atheromatous irregularity. No pericardial effusion. Mediastinum/Nodes: No adenopathy localized. Lungs/Pleura: Lungs are clear. No pleural effusion or pneumothorax. Musculoskeletal: No chest wall abnormality. No acute or significant osseous findings. Review of the MIP images confirms the above findings. CT ABDOMEN and PELVIS FINDINGS Hepatobiliary: No focal liver abnormality is seen. No gallstones, gallbladder wall thickening, or biliary dilatation. Pancreas: Unremarkable. No pancreatic ductal dilatation or surrounding inflammatory changes. Spleen: Normal in size without focal abnormality. Adrenals/Urinary Tract: Adrenal glands are unremarkable. Kidneys are normal, without renal calculi, focal lesion, or hydronephrosis. Bladder is unremarkable. Stomach/Bowel: Stomach and small bowel are decompressed. Appendix not discretely identified. Colon is nondilated. Vascular/Lymphatic: No significant vascular findings are present. No enlarged abdominal or pelvic lymph nodes. Bilateral pelvic phleboliths. Reproductive: Uterus and bilateral adnexa are unremarkable. Other: Trace free pelvic fluid as before, probably physiologic. No free air. Musculoskeletal: No acute or significant osseous findings. Review of the MIP images confirms the above findings. IMPRESSION: 1. Negative for acute PE or thoracic aortic dissection. 2. No acute abdominal process. 3. Electronically Signed   By: Corlis Leak M.D.   On: 06/29/2016 14:51   Dg Abd 2 Views  Result Date: 06/29/2016 CLINICAL DATA:  Chest abdominal pain with nausea and vomiting since yesterday. EXAM:  ABDOMEN - 2 VIEW COMPARISON:  None. FINDINGS: The bowel gas pattern is normal. There  is no evidence of free air. No radio-opaque calculi or other significant radiographic abnormality is seen. IMPRESSION: Negative. Electronically Signed   By: Amie Portland M.D.   On: 06/29/2016 11:01     Labs:   Basic Metabolic Panel:  Recent Labs Lab 06/29/16 0926 06/30/16 0337 07/01/16 0426 07/02/16 0618  NA 135 135 135 136  K 4.1 3.8 3.6 3.7  CL 104 103 105 104  CO2 GLUCOSE 117* 142* 113* 97  BUN 6 5* 5* 6  CREATININE 1.00 1.03* 1.02* 1.11*  CALCIUM 9.6 8.7* 8.5* 8.7*   GFR Estimated Creatinine Clearance: 82 mL/min (A) (by C-G formula based on SCr of 1.11 mg/dL (H)). Liver Function Tests:  Recent Labs Lab 06/29/16 0926  AST 27  ALT 15  ALKPHOS 89  BILITOT 0.5  PROT 8.1  ALBUMIN 4.2    Recent Labs Lab 06/29/16 0926  LIPASE 27   No results for input(s): AMMONIA in the last 168 hours. Coagulation profile  Recent Labs Lab 06/29/16 1917  INR 1.11    CBC:  Recent Labs Lab 06/29/16 0926 06/30/16 0337 07/01/16 0426 07/02/16 0618  WBC 6.9 7.3 5.0 5.1  HGB 11.7* 10.6* 10.1* 10.1*  HCT 34.3* 32.0* 30.3* 31.0*  MCV 75.4* 75.7* 76.1* 76.5*  PLT 253 261 236 251   Cardiac Enzymes: No results for input(s): CKTOTAL, CKMB, CKMBINDEX, TROPONINI in the last 168 hours. BNP: Invalid input(s): POCBNP CBG:  Recent Labs Lab 07/01/16 0842 07/01/16 1154 07/01/16 1845 07/01/16 2027 07/02/16 0724  GLUCAP 110* 103* 147* 100* 97   D-Dimer  Recent Labs  06/29/16 0939  DDIMER 1.08*   Hgb A1c  Recent Labs  06/29/16 1917  HGBA1C 5.7*   Lipid Profile No results for input(s): CHOL, HDL, LDLCALC, TRIG, CHOLHDL, LDLDIRECT in the last 72 hours. Thyroid function studies  Recent Labs  06/29/16 1917  TSH 0.263*   Anemia work up No results for input(s): VITAMINB12, FOLATE, FERRITIN, TIBC, IRON, RETICCTPCT in the last 72 hours. Microbiology No results found  for this or any previous visit (from the past 240 hour(s)).   Discharge Instructions:   Discharge Instructions    Diet Carb Modified    Complete by:  As directed    Discharge instructions    Complete by:  As directed    Liquid diet-- advance to card mod as tolerated   Increase activity slowly    Complete by:  As directed      Allergies as of 07/02/2016      Reactions   Tramadol Rash      Medication List    STOP taking these medications   ibuprofen 600 MG tablet Commonly known as:  ADVIL,MOTRIN   medroxyPROGESTERone 5 MG tablet Commonly known as:  PROVERA   ondansetron 4 MG tablet Commonly known as:  ZOFRAN     TAKE these medications   ondansetron 4 MG disintegrating tablet Commonly known as:  ZOFRAN ODT Take 1 tablet (4 mg total) by mouth every 8 (eight) hours as needed for nausea or vomiting.      Follow-up Information    Primary Care Provider.   Why:  As needed if symptoms continue, For chronic management of this issue       [facility].   Specialty:  Gastroenterology Why:  As needed if symptoms continue Contact information: [address] [place] [address] [phone]       [facility] EMERGENCY DEPARTMENT.   Specialty:  Emergency Medicine Why:  As needed, If symptoms worsen Contact information: [address] [id number] mc [place] [address] [phone]           Time coordinating discharge: 35 min  Signed:  Tamre Cass Juanetta Gosling   [facility] 07/02/2016, 8:52 AM

## 2016-11-20 ENCOUNTER — Encounter (HOSPITAL_COMMUNITY): Payer: Self-pay

## 2016-11-20 ENCOUNTER — Emergency Department (HOSPITAL_COMMUNITY)
Admission: EM | Admit: 2016-11-20 | Discharge: 2016-11-20 | Disposition: A | Discharge status: Home / Self Care | Payer: 59 | Attending: Emergency Medicine | Admitting: Emergency Medicine

## 2016-11-20 DIAGNOSIS — L02214 Cutaneous abscess of groin: Secondary | ICD-10-CM | POA: Diagnosis not present

## 2016-11-20 DIAGNOSIS — Z23 Encounter for immunization: Secondary | ICD-10-CM | POA: Diagnosis not present

## 2016-11-20 DIAGNOSIS — I10 Essential (primary) hypertension: Secondary | ICD-10-CM | POA: Diagnosis not present

## 2016-11-20 DIAGNOSIS — Z87891 Personal history of nicotine dependence: Secondary | ICD-10-CM | POA: Diagnosis not present

## 2016-11-20 MED ORDER — IBUPROFEN 600 MG PO TABS: 600.0000 mg | ORAL_TABLET | Freq: Four times a day (QID) | ORAL | 0 refills | Status: AC | PRN

## 2016-11-20 MED ORDER — TETANUS-DIPHTH-ACELL PERTUSSIS 5-2.5-18.5 LF-MCG/0.5 IM SUSP
0.5000 mL | Freq: Once | INTRAMUSCULAR | Status: AC
  Administered 2016-11-20: 0.5 mL via INTRAMUSCULAR
  Filled 2016-11-20: qty 0.5

## 2016-11-20 MED ORDER — LIDOCAINE-EPINEPHRINE (PF) 2 %-1:200000 IJ SOLN
10.0000 mL | Freq: Once | INTRAMUSCULAR | Status: AC
  Administered 2016-11-20: 10 mL via INTRADERMAL
  Filled 2016-11-20: qty 20

## 2016-11-20 MED ORDER — CLINDAMYCIN HCL 150 MG PO CAPS: 150.0000 mg | ORAL_CAPSULE | Freq: Four times a day (QID) | ORAL | 0 refills | Status: AC

## 2016-11-20 NOTE — ED Triage Notes (Signed)
Pt endorses bump to right groin area. Pt appears to have quarter sized abscess with redness. Not open and no bleeding or drainage. VSS

## 2016-11-20 NOTE — ED Notes (Signed)
Pt verbalized understanding of discharge instructions and denies any further questions at this time.   

## 2016-11-20 NOTE — ED Provider Notes (Signed)
[facility]-EMERGENCY DEPT Provider Note   CSN: [id number] Arrival date & time: 11/20/16  1254     History   Chief Complaint Chief Complaint  Patient presents with  . Abscess    HPI Maria Weaver is a 26 y.o. female.  HPI   26 year old female presenting for evaluation of skin infection. Patient noticed a small bump to the right groin region approximately a week ago. For the past 3 days has increased in size, and become much more painful. Pain is a sharp throbbing sensation radiates to her hip. She tries using warm compress without relief. She denies any fever, dysuria, or rash. No recent injury. She stopped shaving the affected site. She's not UTD with immunization.   Past Medical History:  Diagnosis Date  . Dysfunctional uterine bleeding   . Heart murmur   . Hypertension     Patient Active Problem List   Diagnosis Date Noted  . Intractable vomiting 06/29/2016    Past Surgical History:  Procedure Laterality Date  . CESAREAN SECTION      OB History    No data available       Home Medications    Prior to Admission medications   Medication Sig Start Date End Date Taking? Authorizing Provider  ondansetron (ZOFRAN ODT) 4 MG disintegrating tablet Take 1 tablet (4 mg total) by mouth every 8 (eight) hours as needed for nausea or vomiting. 07/02/16   Joseph Art, DO    Family History Family History  Problem Relation Age of Onset  . Heart failure Father     Social History Social History  Substance Use Topics  . Smoking status: Former Smoker    Packs/day: 0.14    Types: Cigarettes    Quit date: 2017  . Smokeless tobacco: Never Used  . Alcohol use No     Allergies   Tramadol   Review of Systems Review of Systems  Constitutional: Negative for fever.  Gastrointestinal: Negative for abdominal pain.  Neurological: Negative for numbness.     Physical Exam Updated Vital Signs BP 126/75 (BP Location: Left Arm)   Pulse 64   Temp 98.2 F (36.8 C) (Oral)    Resp 18   Ht 5\' 7"  (1.702 m)   Wt 81.6 kg (180 lb)   LMP 11/07/2016 (Exact Date)   SpO2 98%   BMI 28.19 kg/m   Physical Exam  Constitutional: She appears well-developed and well-nourished. No distress.  HENT:  Head: Atraumatic.  Eyes: Conjunctivae are normal.  Neck: Neck supple.  Abdominal: Soft. She exhibits no distension. There is no tenderness.  Genitourinary:  Genitourinary Comments: Chaperone present during exam. An area of induration without significant fluctuance noted to the right inguinal fold, tender to palpation without any significant surrounding skin erythema. Keloid formation noted along horizontal C-section scar.    Neurological: She is alert.  Skin: No rash noted.  Psychiatric: She has a normal mood and affect.  Nursing note and vitals reviewed.    ED Treatments / Results  Labs (all labs ordered are listed, but only abnormal results are displayed) Labs Reviewed - No data to display  EKG  EKG Interpretation None       Radiology No results found.  Procedures .Marland KitchenIncision and Drainage Date/Time: 11/20/2016 2:08 PM Performed by: Fayrene Helper Authorized by: Fayrene Helper   Consent:    Consent obtained:  Verbal   Consent given by:  Patient   Risks discussed:  Incomplete drainage, infection and pain   Alternatives discussed:  No treatment and alternative treatment Location:    Type:  Abscess   Size:  2cm   Location:  Anogenital   Anogenital location: R inguinal fold. Pre-procedure details:    Skin preparation:  Betadine Anesthesia (see MAR for exact dosages):    Anesthesia method:  Local infiltration   Local anesthetic:  Lidocaine 2% WITH epi Procedure type:    Complexity:  Simple Procedure details:    Needle aspiration: no     Incision types:  Stab incision   Incision depth:  Subcutaneous   Scalpel blade:  11   Wound management:  Probed and deloculated   Drainage:  Purulent   Drainage amount:  Scant   Packing materials:  None Post-procedure  details:    Patient tolerance of procedure:  Tolerated well, no immediate complications   (including critical care time)  Medications Ordered in ED Medications - No data to display   Initial Impression / Assessment and Plan / ED Course  I have reviewed the triage vital signs and the nursing notes.  Pertinent labs & imaging results that were available during my care of the patient were reviewed by me and considered in my medical decision making (see chart for details).     BP 126/75 (BP Location: Left Arm)   Pulse 64   Temp 98.2 F (36.8 C) (Oral)   Resp 18   Ht 5\' 7"  (1.702 m)   Wt 81.6 kg (180 lb)   LMP 11/07/2016 (Exact Date)   SpO2 98%   BMI 28.19 kg/m    Final Clinical Impressions(s) / ED Diagnoses   Final diagnoses:  Soft tissue abscess of inguinal region    New Prescriptions New Prescriptions   CLINDAMYCIN (CLEOCIN) 150 MG CAPSULE    Take 1 capsule (150 mg total) by mouth every 6 (six) hours.   IBUPROFEN (ADVIL,MOTRIN) 600 MG TABLET    Take 1 tablet (600 mg total) by mouth every 6 (six) hours as needed.   2:11 PM Pt has a cutaneous abscess to R inguinal fold, successful I&D.  No packing placed.  Pt d/c home with ibuprofen and clindamycin.  Warm compress recommended. Return precaution given.    Fayrene Helper, PA-C 11/20/16 1412    Benjiman Core, MD 11/20/16 [phone]

## 2016-11-20 NOTE — ED Notes (Signed)
I & D tray set up at bedside if needed.

## 2018-04-02 ENCOUNTER — Emergency Department (HOSPITAL_COMMUNITY)
Admission: EM | Admit: 2018-04-02 | Discharge: 2018-04-02 | Discharge status: Against Medical Advice | Payer: 59 | Attending: Emergency Medicine | Admitting: Emergency Medicine

## 2018-04-02 DIAGNOSIS — Z5321 Procedure and treatment not carried out due to patient leaving prior to being seen by health care provider: Secondary | ICD-10-CM | POA: Insufficient documentation

## 2018-04-02 DIAGNOSIS — R05 Cough: Secondary | ICD-10-CM | POA: Insufficient documentation

## 2018-04-02 NOTE — ED Notes (Signed)
Called ptx3 in waiting room. No answer

## 2018-04-02 NOTE — ED Notes (Signed)
Called pt 3 times. No answer

## 2018-04-02 NOTE — ED Triage Notes (Signed)
Pt here with cough times 2 weeks no fever

## 2018-11-08 IMAGING — CT CT ABD-PELV W/ CM
3 of 9 series · 12 of 46 positions shown, 18 images · IV contrast (isovue)
Comparison: 11/03/2014 and previous

CLINICAL DATA: Chest and abdominal pain, vomiting

EXAM:
CT ANGIOGRAPHY CHEST
CT ABDOMEN AND PELVIS WITH CONTRAST
TECHNIQUE: Multidetector CT imaging of the chest was performed using the
standard protocol during bolus administration of intravenous
contrast. Multiplanar CT image reconstructions and MIPs were
obtained to evaluate the vascular anatomy. Multidetector CT imaging
of the abdomen and pelvis was performed using the standard protocol
during bolus administration of intravenous contrast.
CONTRAST:  100 mL Isovue 370 IV

[Series 7: pe thins · axial · 0.78mm/px · z∈[+529,+704]mm · 6 of 281 slices shown]
[im 18/281  soft-tissue]
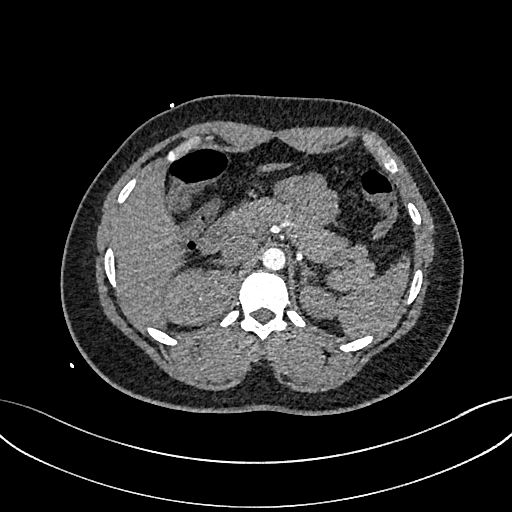
[im 53/281  soft-tissue]
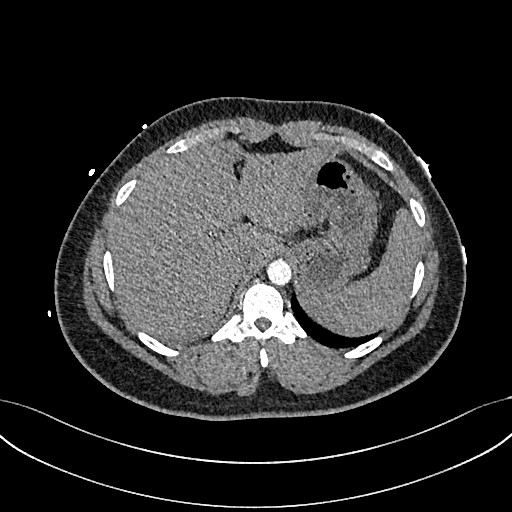
[im 88/281  soft-tissue]
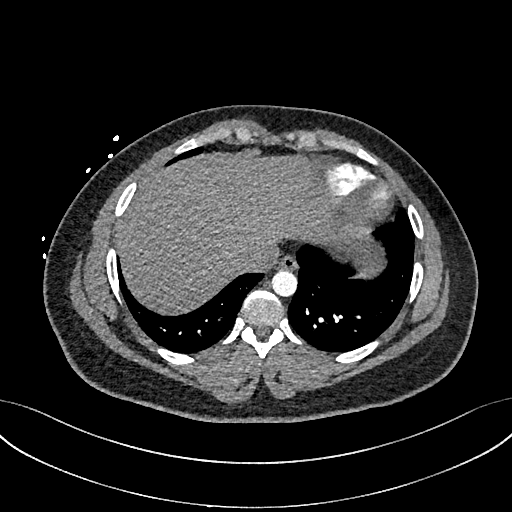
[im 123/281  soft-tissue]
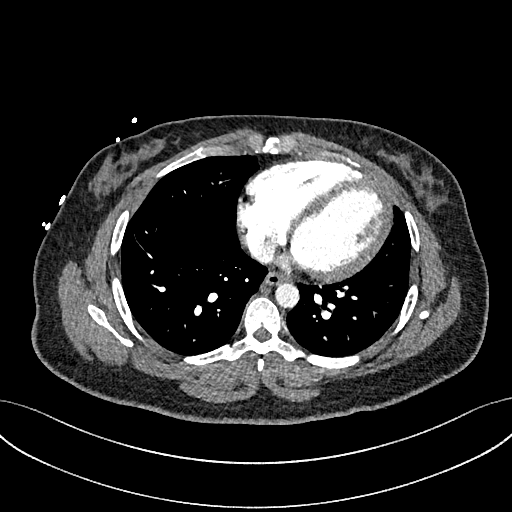
[im 158/281  soft-tissue]
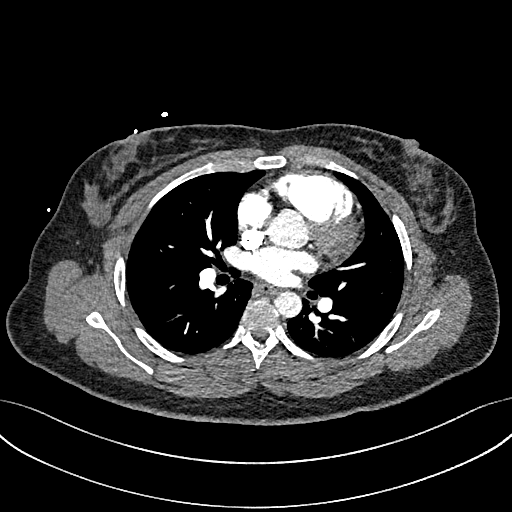
[im 193/281  soft-tissue]
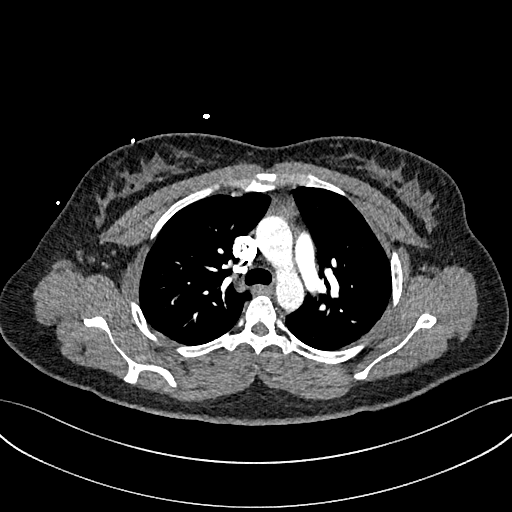

[Series 8: pe 2mm cor · coronal · 0.56mm/px · 2 of 156 slices shown, 3 images]
[im 52/156  soft-tissue]
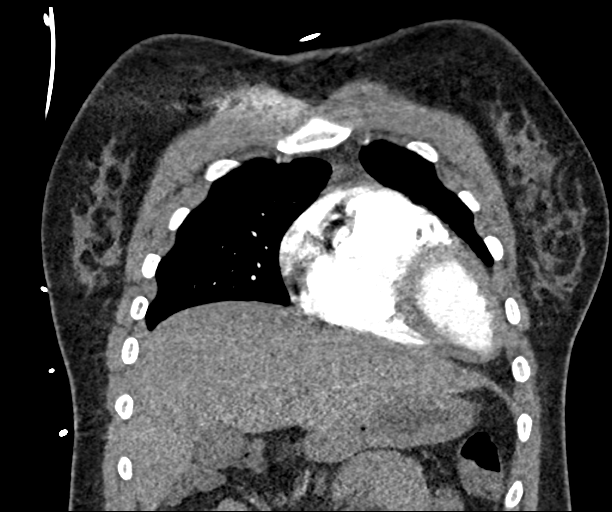
[im 52/156  bone]
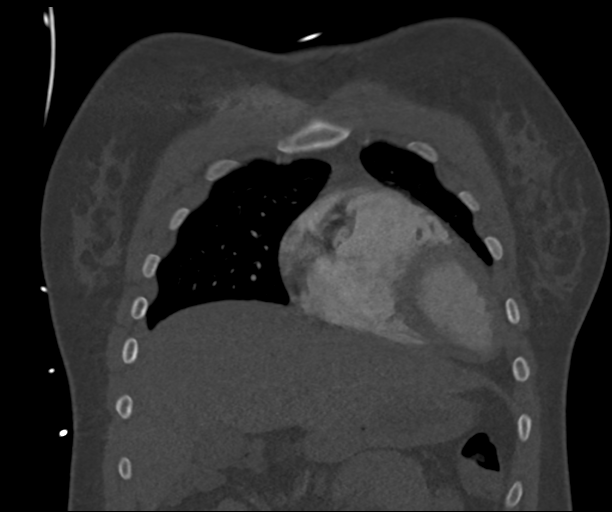
[im 104/156  soft-tissue]
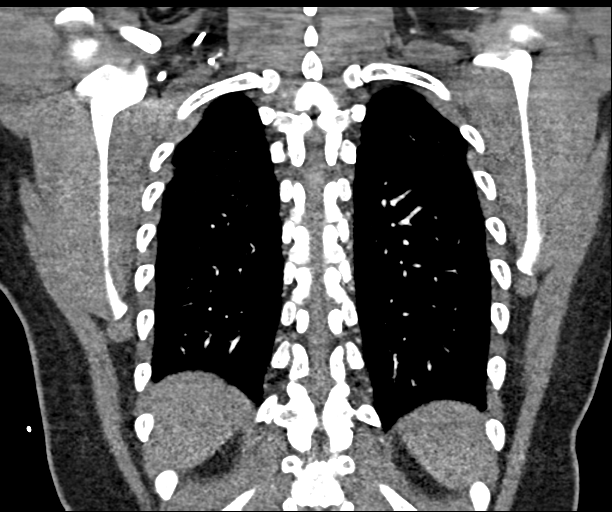

[Series 12: a/p w/ 5mm · axial · 0.85mm/px · z∈[+248,+552]mm · 4 of 103 slices shown, 9 images]
[im 21/103  soft-tissue]
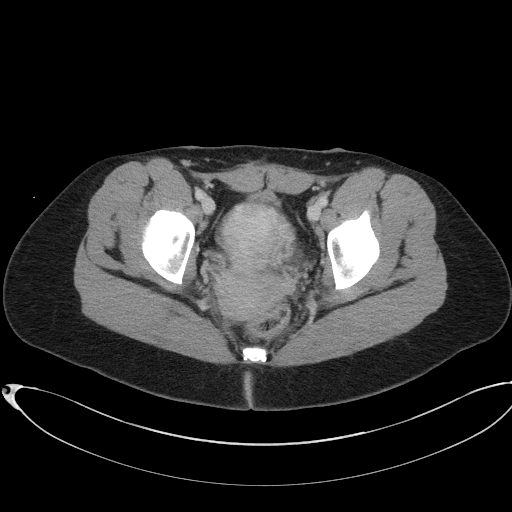
[im 21/103  lung]
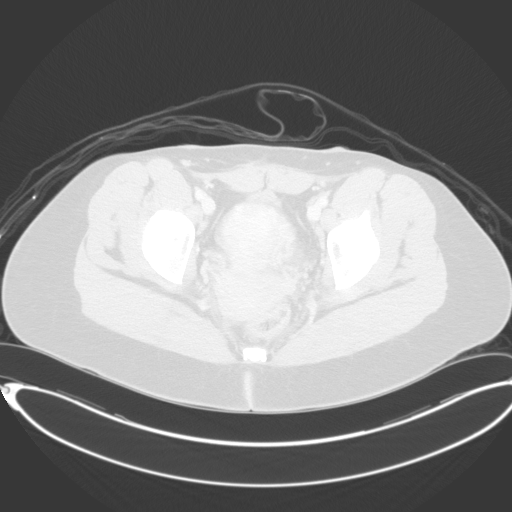
[im 21/103  bone]
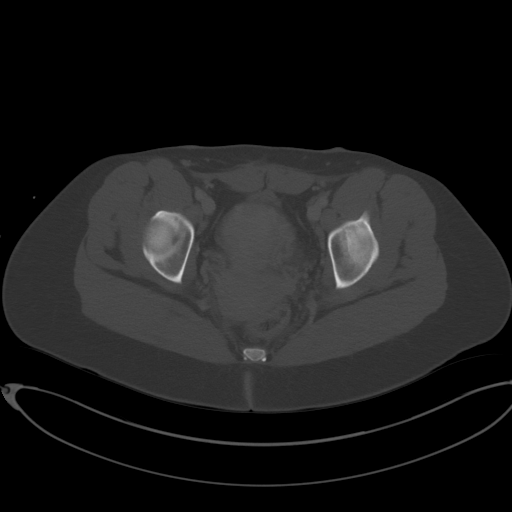
[im 41/103  soft-tissue]
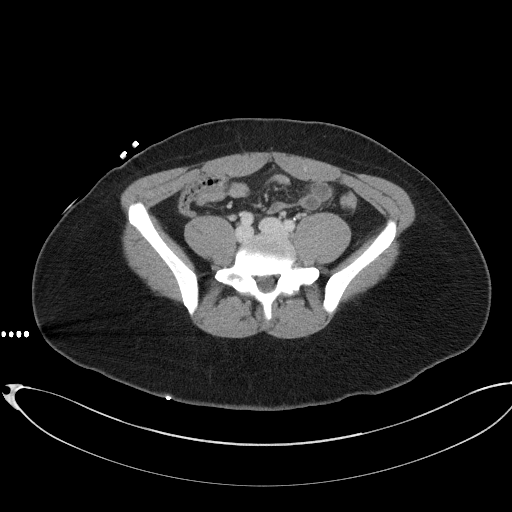
[im 41/103  lung]
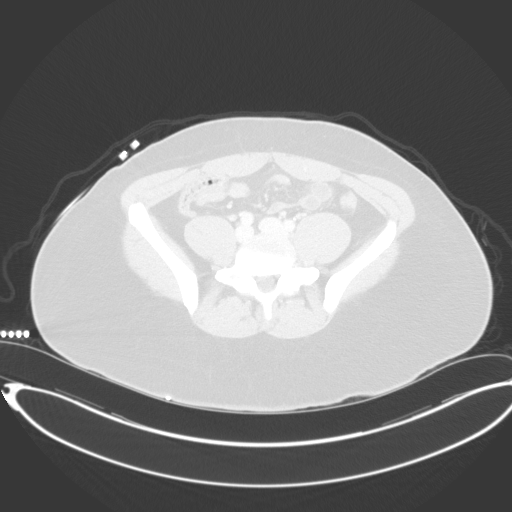
[im 62/103  soft-tissue]
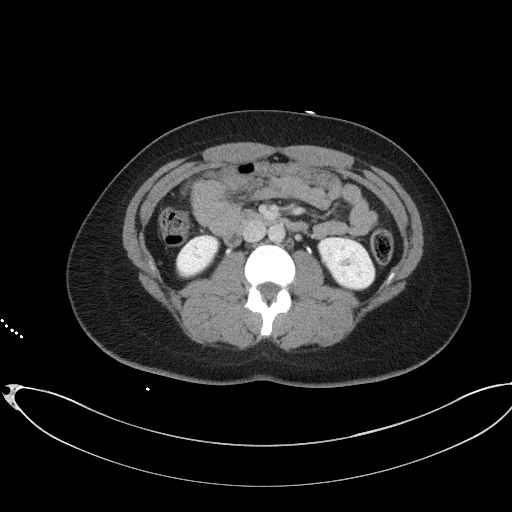
[im 62/103  lung]
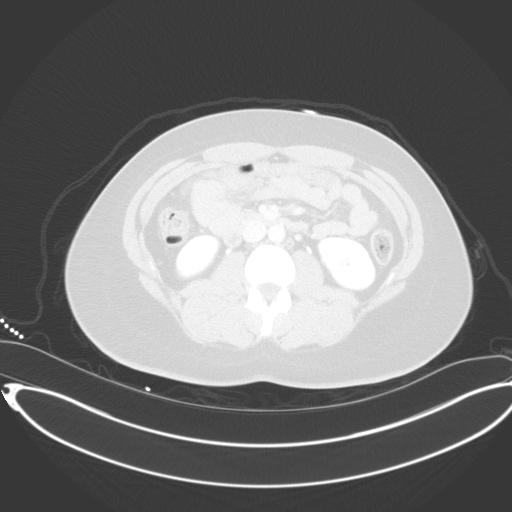
[im 82/103  soft-tissue]
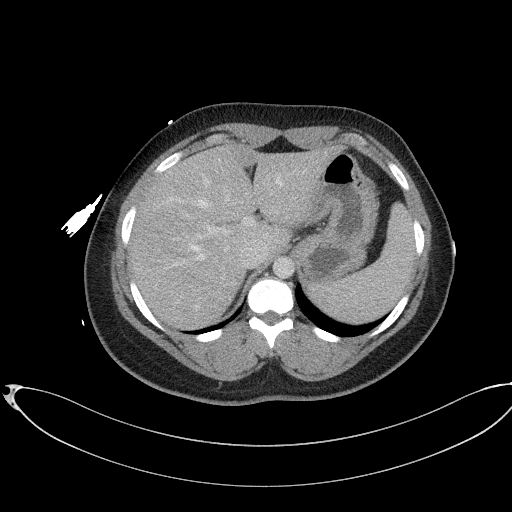
[im 82/103  lung]
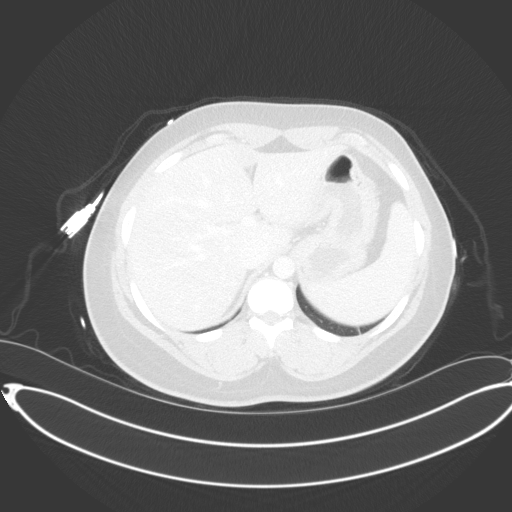

[12 of 46 positions shown; findings below may reference images not displayed]

FINDINGS: CTA CHEST FINDINGS

Cardiovascular: Right ventricle nondilated. Satisfactory
opacification of pulmonary arteries noted, and there is no evidence
of pulmonary emboli. Adequate contrast opacification of the thoracic
aorta with no evidence of dissection, aneurysm, or stenosis. There
is classic 3-vessel brachiocephalic arch anatomy without proximal
stenosis. No significant atheromatous irregularity. No pericardial
effusion.

Mediastinum/Nodes: No adenopathy localized.

Lungs/Pleura: Lungs are clear. No pleural effusion or pneumothorax.

Musculoskeletal: No chest wall abnormality. No acute or significant
osseous findings.

Review of the MIP images confirms the above findings.

CT ABDOMEN and PELVIS FINDINGS

Hepatobiliary: No focal liver abnormality is seen. No gallstones,
gallbladder wall thickening, or biliary dilatation.

Pancreas: Unremarkable. No pancreatic ductal dilatation or
surrounding inflammatory changes.

Spleen: Normal in size without focal abnormality.

Adrenals/Urinary Tract: Adrenal glands are unremarkable. Kidneys are
normal, without renal calculi, focal lesion, or hydronephrosis.
Bladder is unremarkable.

Stomach/Bowel: Stomach and small bowel are decompressed. Appendix
not discretely identified. Colon is nondilated.

Vascular/Lymphatic: No significant vascular findings are present. No
enlarged abdominal or pelvic lymph nodes. Bilateral pelvic
phleboliths.

Reproductive: Uterus and bilateral adnexa are unremarkable.

Other: Trace free pelvic fluid as before, probably physiologic. No
free air.

Musculoskeletal: No acute or significant osseous findings.

Review of the MIP images confirms the above findings.
IMPRESSION: 1. Negative for acute PE or thoracic aortic dissection.
2. No acute abdominal process.
3.

## 2023-05-19 ENCOUNTER — Other Ambulatory Visit: Payer: Self-pay

## 2023-05-19 ENCOUNTER — Encounter (HOSPITAL_COMMUNITY): Payer: Self-pay

## 2023-05-19 ENCOUNTER — Emergency Department (HOSPITAL_COMMUNITY)
Admission: EM | Admit: 2023-05-19 | Discharge: 2023-05-20 | Discharge status: Against Medical Advice | Payer: Medicaid Other | Attending: Emergency Medicine | Admitting: Emergency Medicine

## 2023-05-19 DIAGNOSIS — R42 Dizziness and giddiness: Secondary | ICD-10-CM | POA: Insufficient documentation

## 2023-05-19 DIAGNOSIS — Z5321 Procedure and treatment not carried out due to patient leaving prior to being seen by health care provider: Secondary | ICD-10-CM | POA: Insufficient documentation

## 2023-05-19 DIAGNOSIS — R110 Nausea: Secondary | ICD-10-CM | POA: Insufficient documentation

## 2023-05-19 DIAGNOSIS — R6883 Chills (without fever): Secondary | ICD-10-CM | POA: Insufficient documentation

## 2023-05-19 DIAGNOSIS — R002 Palpitations: Secondary | ICD-10-CM | POA: Diagnosis not present

## 2023-05-19 LAB — BASIC METABOLIC PANEL
Anion gap: 9 (ref 5–15)
BUN: 11 mg/dL (ref 6–20)
CO2: 23 mmol/L (ref 22–32)
Calcium: 8.6 mg/dL — ABNORMAL LOW (ref 8.9–10.3)
Chloride: 103 mmol/L (ref 98–111)
Creatinine, Ser: 0.88 mg/dL (ref 0.44–1.00)
GFR, Estimated: >60 mL/min (ref >60)
Glucose, Bld: 126 mg/dL — ABNORMAL HIGH (ref 70–99)
Potassium: 4 mmol/L (ref 3.5–5.1)
Sodium: 135 mmol/L (ref 135–145)

## 2023-05-19 LAB — RESP PANEL BY RT-PCR (RSV, FLU A&B, COVID)  RVPGX2
Influenza A by PCR: NEGATIVE
Influenza B by PCR: NEGATIVE
Resp Syncytial Virus by PCR: NEGATIVE
SARS Coronavirus 2 by RT PCR: NEGATIVE

## 2023-05-19 LAB — CBC
HCT: 31.9 % — ABNORMAL LOW (ref 36.0–46.0)
Hemoglobin: 10.3 g/dL — ABNORMAL LOW (ref 12.0–15.0)
MCH: 24 pg — ABNORMAL LOW (ref 26.0–34.0)
MCHC: 32.3 g/dL (ref 30.0–36.0)
MCV: 74.4 fL — ABNORMAL LOW (ref 80.0–100.0)
Platelets: 368 10*3/uL (ref 150–400)
RBC: 4.29 MIL/uL (ref 3.87–5.11)
RDW: 20.2 % — ABNORMAL HIGH (ref 11.5–15.5)
WBC: 6.1 10*3/uL (ref 4.0–10.5)
nRBC: 0 % (ref 0.0–0.2)

## 2023-05-19 NOTE — ED Provider Triage Note (Signed)
Emergency Medicine Provider Triage Evaluation Note  Maria Weaver , a 33 y.o. female  was evaluated in triage.  Pt complains of chills, lightheadedness, nausea and heart palpitations.  Symptoms all started 3 days ago.  Denies chest pain or shortness of breath.  States family member at home has the flu.  Denies abdominal pain.  Review of Systems  Positive: See above Negative: See above  Physical Exam  Ht 5\' 7"  (1.702 m)   Wt 81.6 kg   BMI 28.18 kg/m  Gen:   Awake, no distress   Resp:  Normal effort  MSK:   Moves extremities without difficulty  Other:    Medical Decision Making  Medically screening exam initiated at 6:29 PM.  Appropriate orders placed.  Maria Weaver was informed that the remainder of the evaluation will be completed by another provider, this initial triage assessment does not replace that evaluation, and the importance of remaining in the ED until their evaluation is complete.  Work up started   Gareth Eagle, PA-C 05/19/23 1829

## 2023-05-19 NOTE — ED Triage Notes (Signed)
Pt c/o chills, nausea, lightheadedness, heart palpitationsx3d.

## 2023-05-19 NOTE — ED Notes (Signed)
Pt called for vitals x2. No response.

## 2023-05-20 NOTE — ED Notes (Signed)
Patient decided to leave AMA
